# Patient Record
Sex: Male | Born: 1965
Health system: Southern US, Community
[De-identification: ages and names within clinical notes are randomized; demographics above are authoritative.]

## PROBLEM LIST (undated history)

## (undated) DIAGNOSIS — M5416 Radiculopathy, lumbar region: Secondary | ICD-10-CM

## (undated) DIAGNOSIS — E785 Hyperlipidemia, unspecified: Secondary | ICD-10-CM

## (undated) DIAGNOSIS — H9319 Tinnitus, unspecified ear: Secondary | ICD-10-CM

## (undated) HISTORY — DX: Radiculopathy, lumbar region: M54.16

## (undated) HISTORY — PX: OTHER SURGICAL HISTORY: SHX169

## (undated) HISTORY — DX: Hyperlipidemia, unspecified: E78.5

## (undated) HISTORY — DX: Tinnitus, unspecified ear: H93.19

---

## 2011-12-25 ENCOUNTER — Ambulatory Visit: Payer: Self-pay | Admitting: Family Medicine

## 2012-04-15 ENCOUNTER — Encounter: Payer: Self-pay | Admitting: Family Medicine

## 2012-04-15 ENCOUNTER — Ambulatory Visit (INDEPENDENT_AMBULATORY_CARE_PROVIDER_SITE_OTHER): Payer: BC Managed Care – PPO | Admitting: Family Medicine

## 2012-04-15 VITALS — BP 124/80 | HR 63 | Temp 97.6°F | Ht 71.0 in | Wt 183.0 lb

## 2012-04-15 DIAGNOSIS — Z0000 Encounter for general adult medical examination without abnormal findings: Secondary | ICD-10-CM

## 2012-04-15 DIAGNOSIS — H9319 Tinnitus, unspecified ear: Secondary | ICD-10-CM

## 2012-04-15 DIAGNOSIS — B356 Tinea cruris: Secondary | ICD-10-CM

## 2012-04-15 HISTORY — DX: Tinnitus, unspecified ear: H93.19

## 2012-04-15 NOTE — Assessment & Plan Note (Signed)
Mild, no suspicion of significant pathology.  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 Reassured pt and he was ok with watchful waiting approach.  Return or call if any vertigo/dizziness, hearing deficit, or ataxia or headaches start coming with the ringing in ears.

## 2012-04-15 NOTE — Progress Notes (Signed)
Office Note 04/15/2012  CC:  Chief Complaint  Patient presents with  . Establish Care    no problems    HPI:  Alan Rojas is a 45 y.o. White male who is here today to establish care. Patient's most recent primary MD: none Old records were not reviewed prior to or during today's visit.  Has rash in groin, sweats a lot in groin, saw dermatologist (Dr. Lovey Newcomer) in the past--asks about help with keeping area from sweating so much.  Sounds like tinea cruris was dx'd and he has applied topical tx regularly but has never gotten it to completely go away--feels like this is the only region he sweats.  Also describes ringing in ears, constant for years and very mild, but worsens off and on usually when under stressful time in life, when he is behind on sleep. Denies feeling hearing loss.  No pain or muffled sensation in ears.  Notices it the most when everything is quiet.  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 No vertigo, no nausea.  No headaches or ear pain.  PMH: none (question of hx of low vit D level and high cholesterol when he last saw an MID (Dr. Ardine Bjork in [place] approx 5 yrs ago: he never returned for follow up).   PSH: none History reviewed. No pertinent past surgical history.  Family History  Problem Relation Age of Onset  . Hypertension Mother   . Hypertension Maternal Grandmother   . Prostate cancer Maternal Grandfather     History   Social History  . Marital Status: Married    Spouse Name: N/A    Number of Children: N/A  . Years of Education: N/A   Occupational History  . Not on file.   Social History Main Topics  . Smoking status: Never Smoker   . Smokeless tobacco: Never Used  . Alcohol Use: No  . Drug Use: No  . Sexually Active: Not on file   Other Topics Concern  . 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  Wife has degree in  education.Relocated for [employer] approx 2006 to work for [employer].No T/A/Ds.Exercises: walks in neighborhood 3-4 times per week.    Outpatient Encounter Prescriptions as of 04/15/2012  Medication Sig Dispense Refill  . Cholecalciferol (VITAMIN D PO) Take 1 tablet by mouth daily.      . Flaxseed, Linseed, (FLAX SEED OIL PO) Take 1 capsule by mouth daily.        Not on File  ROS Review of Systems  Constitutional: Negative for fever and fatigue.  HENT: Negative for congestion and sore throat.   Eyes: Negative for visual disturbance.  Respiratory: Negative for cough.   Cardiovascular: Negative for chest pain.  Gastrointestinal: Negative for nausea and abdominal pain.  Genitourinary: Negative for dysuria.  Musculoskeletal: Negative for back pain and joint swelling.  Neurological: Negative for weakness and headaches.  Hematological: Negative for adenopathy.    PE; Blood pressure 124/80, pulse 63, temperature 97.6 F (36.4 C), temperature source Temporal, height 5\' 11"  (1.803 m), weight 183 lb (83.008 kg), SpO2 99.00%. Gen: Alert, well appearing.  Patient is oriented to person, place, time, and situation. EARS: EAC's normal AU, TM's with good light reflex and landmarks bilaterally.   CN 2-12 intact grossly bilaterally.  Pertinent labs:  none  ASSESSMENT AND PLAN:   New pt: no old records to obtain.  Tinnitus Mild, no suspicion of significant pathology.  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 Reassured pt and he was ok with watchful waiting approach.  Return or call if any vertigo/dizziness, hearing deficit, or ataxia or headaches start coming with the ringing in ears.  Tinea cruris Pt really just wanted to know if it would be ok to try antiperspirant in the groin area to try to break the cycle of tinea he gets there. I said it would be fine.  If this OTC product ineffective, will offer  Drysol.    Return for o/v for CPE at pt's convenience; also needs lab visit for fasting labs the week prior.

## 2012-04-15 NOTE — Assessment & Plan Note (Signed)
Pt really just wanted to know if it would be ok to try antiperspirant in the groin area to try to break the cycle of tinea he gets there. I said it would be fine.  If this OTC product ineffective, will offer Drysol.

## 2012-04-29 ENCOUNTER — Other Ambulatory Visit (INDEPENDENT_AMBULATORY_CARE_PROVIDER_SITE_OTHER): Payer: BC Managed Care – PPO

## 2012-04-29 DIAGNOSIS — Z0000 Encounter for general adult medical examination without abnormal findings: Secondary | ICD-10-CM

## 2012-04-29 LAB — LIPID PANEL
HDL: 50.4 mg/dL (ref >39.00)
Triglycerides: 84 mg/dL (ref 0.0–149.0)
VLDL: 16.8 mg/dL (ref 0.0–40.0)

## 2012-04-29 LAB — CBC WITH DIFFERENTIAL/PLATELET
Basophils Relative: 0.4 % (ref 0.0–3.0)
Eosinophils Relative: 1.9 % (ref 0.0–5.0)
HCT: 39.6 % (ref 39.0–52.0)
Hemoglobin: 13 g/dL (ref 13.0–17.0)
Lymphocytes Relative: 27.8 % (ref 12.0–46.0)
Lymphs Abs: 1 10*3/uL (ref 0.7–4.0)
Monocytes Relative: 10 % (ref 3.0–12.0)
Neutro Abs: 2.2 10*3/uL (ref 1.4–7.7)
RBC: 4.49 Mil/uL (ref 4.22–5.81)
WBC: 3.7 10*3/uL — ABNORMAL LOW (ref 4.5–10.5)

## 2012-04-29 LAB — COMPREHENSIVE METABOLIC PANEL
AST: 21 U/L (ref 0–37)
Albumin: 4.5 g/dL (ref 3.5–5.2)
Alkaline Phosphatase: 54 U/L (ref 39–117)
BUN: 15 mg/dL (ref 6–23)
Potassium: 4.4 mEq/L (ref 3.5–5.1)
Sodium: 141 mEq/L (ref 135–145)
Total Bilirubin: 0.6 mg/dL (ref 0.3–1.2)
Total Protein: 7.2 g/dL (ref 6.0–8.3)

## 2012-04-29 LAB — LDL CHOLESTEROL, DIRECT: Direct LDL: 137.1 mg/dL

## 2012-05-06 ENCOUNTER — Encounter: Payer: Self-pay | Admitting: Family Medicine

## 2012-05-06 ENCOUNTER — Ambulatory Visit (INDEPENDENT_AMBULATORY_CARE_PROVIDER_SITE_OTHER): Payer: BC Managed Care – PPO | Admitting: Family Medicine

## 2012-05-06 VITALS — BP 133/87 | HR 68 | Temp 97.6°F | Ht 71.0 in | Wt 184.8 lb

## 2012-05-06 DIAGNOSIS — Z23 Encounter for immunization: Secondary | ICD-10-CM

## 2012-05-06 DIAGNOSIS — Z0000 Encounter for general adult medical examination without abnormal findings: Secondary | ICD-10-CM

## 2012-05-06 NOTE — Progress Notes (Signed)
Office Note 05/06/2012  CC:  Chief Complaint  Patient presents with  . Annual Exam    physical    HPI:  Alan Rojas is a 46 y.o. White male who goes by "Alan Rojas" and is here for annual health maintenance exam. He has no complaints.  We reviewed his labs today in detail--all were good.   PMH: no significant problems, no hospitalization.  No past medical history on file.  PSH: none No past surgical history on file.  Family History  Problem Relation Age of Onset  . Hypertension Mother   . Hypertension Maternal Grandmother   . Prostate cancer Maternal Grandfather     History   Social History  . Marital Status: Married    Spouse Name: N/A    Number of Children: N/A  . Years of Education: N/A   Occupational History  . Not on file.   Social History Main Topics  . Smoking status: Never Smoker   . Smokeless tobacco: Never Used  . Alcohol Use: No  . Drug Use: No  . Sexually Active: Not on file   Other Topics Concern  . 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  Wife has degree in education.Relocated for [employer] approx 2006 to work for [employer].No T/A/Ds.Exercises: walks in neighborhood 3-4 times per week.    Outpatient Prescriptions Prior to Visit  Medication Sig Dispense Refill  . Flaxseed, Linseed, (FLAX SEED OIL PO) Take 1 capsule by mouth daily.      . Cholecalciferol (VITAMIN D PO) Take 1 tablet by mouth daily.        No Known Allergies  ROS Review of Systems  Constitutional: Negative for fever, chills, appetite change and fatigue.  HENT: Negative for ear pain, congestion, sore throat, neck stiffness and dental problem.   Eyes: Negative for discharge, redness and visual disturbance.  Respiratory: Negative for cough, chest tightness, shortness of breath and wheezing.   Cardiovascular: Negative for chest pain, palpitations and leg swelling.  Gastrointestinal:  Negative for nausea, vomiting, abdominal pain, diarrhea and blood in stool.  Genitourinary: Negative for dysuria, urgency, frequency, hematuria, flank pain and difficulty urinating.  Musculoskeletal: Negative for myalgias, back pain, joint swelling and arthralgias.  Skin: Negative for pallor and rash.  Neurological: Negative for dizziness, speech difficulty, weakness and headaches.  Hematological: Negative for adenopathy. Does not bruise/bleed easily.  Psychiatric/Behavioral: Negative for confusion and disturbed wake/sleep cycle. The patient is not nervous/anxious.     PE; Blood pressure 133/87, pulse 68, temperature 97.6 F (36.4 C), temperature source Temporal, height 5\' 11"  (1.803 m), weight 184 lb 12.8 oz (83.825 kg), SpO2 98.00%. Gen: Alert, well appearing.  Patient is oriented to person, place, time, and situation. Affect: pleasant, lucid thought and speech. ENT: Ears: EACs clear, normal epithelium.  TMs with good light reflex and landmarks bilaterally.  Eyes: no injection, icteris, swelling, or exudate.  EOMI, PERRLA. Nose: no drainage or turbinate edema/swelling.  No injection or focal lesion.  Mouth: lips without lesion/swelling.  Oral mucosa pink and moist.  Dentition intact and without obvious caries or gingival swelling.  Oropharynx without erythema, exudate, or swelling.  Neck: supple/nontender.  No LAD, mass, or TM.  Carotid pulses 2+ bilaterally, without bruits. CV: RRR, no m/r/g.   LUNGS: CTA bilat, nonlabored resps, good aeration in all lung fields. ABD: soft, NT, ND, BS normal.  No hepatospenomegaly or mass.  No bruits. EXT: no clubbing,  cyanosis, or edema.  Skin - no sores or suspicious lesions or rashes or color changes Genitals normal; both testes normal without tenderness, masses, hydroceles, varicoceles, erythema or swelling. Shaft normal, circumcised, meatus normal without discharge. No inguinal hernia noted. No inguinal lymphadenopathy.  Pertinent labs:  Lab Results    Component Value Date   TSH 1.96 04/29/2012   Lab Results  Component Value Date   WBC 3.7* 04/29/2012   HGB 13.0 04/29/2012   HCT 39.6 04/29/2012   MCV 88.2 04/29/2012   PLT 139.0* 04/29/2012   Lab Results  Component Value Date   CREATININE 1.0 04/29/2012   BUN 15 04/29/2012   NA 141 04/29/2012   K 4.4 04/29/2012   CL 102 04/29/2012   CO2 31 04/29/2012   Lab Results  Component Value Date   ALT 24 04/29/2012   AST 21 04/29/2012   ALKPHOS 54 04/29/2012   BILITOT 0.6 04/29/2012   Lab Results  Component Value Date   CHOL 201* 04/29/2012   Lab Results  Component Value Date   HDL 50.40 04/29/2012   No results found for this basename: LDLCALC   Lab Results  Component Value Date   TRIG 84.0 04/29/2012   Lab Results  Component Value Date   CHOLHDL 4 04/29/2012   No results found for this basename: PSA       ASSESSMENT AND PLAN:   Routine general medical examination at a health care facility Reviewed age and gender appropriate health maintenance issues (prudent diet, regular exercise, health risks of tobacco and excessive alcohol, use of seatbelts, fire alarms in home, use of sunscreen).  Also reviewed age and gender appropriate health screening as well as vaccine recommendations.   Tdap given today.  FOLLOW UP:  No Follow-up on file. F/u for routine med exam in 1-2 yrs.

## 2012-05-06 NOTE — Assessment & Plan Note (Signed)
Reviewed age and gender appropriate health maintenance issues (prudent diet, regular exercise, health risks of tobacco and excessive alcohol, use of seatbelts, fire alarms in home, use of sunscreen).  Also reviewed age and gender appropriate health screening as well as vaccine recommendations.  

## 2012-05-06 NOTE — Addendum Note (Signed)
Addended by: Court Joy on: 05/06/2012 09:19 AM   Modules accepted: Orders

## 2012-05-06 NOTE — Addendum Note (Signed)
Addended by: Baldemar Lenis R on: 05/06/2012 10:02 AM   Modules accepted: Orders

## 2012-05-06 NOTE — Patient Instructions (Signed)
Health Maintenance, Males A healthy lifestyle and preventative care can promote health and wellness.  Maintain regular health, dental, and eye exams.   Eat a healthy diet. Foods like vegetables, fruits, whole grains, low-fat dairy products, and lean protein foods contain the nutrients you need without too many calories. Decrease your intake of foods high in solid fats, added sugars, and salt. Get information about a proper diet from your caregiver, if necessary.   Regular physical exercise is one of the most important things you can do for your health. Most adults should get at least 150 minutes of moderate-intensity exercise (any activity that increases your heart rate and causes you to sweat) each week. In addition, most adults need muscle-strengthening exercises on 2 or more days a week.    Maintain a healthy weight. The body mass index (BMI) is a screening tool to identify possible weight problems. It provides an estimate of body fat based on height and weight. Your caregiver can help determine your BMI, and can help you achieve or maintain a healthy weight. For adults 20 years and older:   A BMI below 18.5 is considered underweight.   A BMI of 18.5 to 24.9 is normal.   A BMI of 25 to 29.9 is considered overweight.   A BMI of 30 and above is considered obese.   Maintain normal blood lipids and cholesterol by exercising and minimizing your intake of saturated fat. Eat a balanced diet with plenty of fruits and vegetables. Blood tests for lipids and cholesterol should begin at age 20 and be repeated every 5 years. If your lipid or cholesterol levels are high, you are over 50, or you are a high risk for heart disease, you may need your cholesterol levels checked more frequently.Ongoing high lipid and cholesterol levels should be treated with medicines, if diet and exercise are not effective.   If you smoke, find out from your caregiver how to quit. If you do not use tobacco, do not start.    If you choose to drink alcohol, do not exceed 2 drinks per day. One drink is considered to be 12 ounces (355 mL) of beer, 5 ounces (148 mL) of wine, or 1.5 ounces (44 mL) of liquor.   Avoid use of street drugs. Do not share needles with anyone. Ask for help if you need support or instructions about stopping the use of drugs.   High blood pressure causes heart disease and increases the risk of stroke. Blood pressure should be checked at least every 1 to 2 years. Ongoing high blood pressure should be treated with medicines if weight loss and exercise are not effective.   If you are 45 to 46 years old, ask your caregiver if you should take aspirin to prevent heart disease.   Diabetes screening involves taking a blood sample to check your fasting blood sugar level. This should be done once every 3 years, after age 45, if you are within normal weight and without risk factors for diabetes. Testing should be considered at a younger age or be carried out more frequently if you are overweight and have at least 1 risk factor for diabetes.   Colorectal cancer can be detected and often prevented. Most routine colorectal cancer screening begins at the age of 50 and continues through age 75. However, your caregiver may recommend screening at an earlier age if you have risk factors for colon cancer. On a yearly basis, your caregiver may provide home test kits to check for hidden   blood in the stool. Use of a small camera at the end of a tube, to directly examine the colon (sigmoidoscopy or colonoscopy), can detect the earliest forms of colorectal cancer. Talk to your caregiver about this at age 50, when routine screening begins. Direct examination of the colon should be repeated every 5 to 10 years through age 75, unless early forms of pre-cancerous polyps or small growths are found.   Hepatitis C blood testing is recommended for all people born from 1945 through 1965 and any individual with known risks for  hepatitis C.   Healthy men should no longer receive prostate-specific antigen (PSA) blood tests as part of routine cancer screening. Consult with your caregiver about prostate cancer screening.   Testicular cancer screening is not recommended for adolescents or adult males who have no symptoms. Screening includes self-exam, caregiver exam, and other screening tests. Consult with your caregiver about any symptoms you have or any concerns you have about testicular cancer.   Practice safe sex. Use condoms and avoid high-risk sexual practices to reduce the spread of sexually transmitted infections (STIs).   Use sunscreen with a sun protection factor (SPF) of 30 or greater. Apply sunscreen liberally and repeatedly throughout the day. You should seek shade when your shadow is shorter than you. Protect yourself by wearing long sleeves, pants, a wide-brimmed hat, and sunglasses year round, whenever you are outdoors.   Notify your caregiver of new moles or changes in moles, especially if there is a change in shape or color. Also notify your caregiver if a mole is larger than the size of a pencil eraser.   A one-time screening for abdominal aortic aneurysm (AAA) and surgical repair of large AAAs by sound wave imaging (ultrasonography) is recommended for ages 65 to 75 years who are current or former smokers.   Stay current with your immunizations.  Document Released: 03/02/2008 Document Revised: 08/24/2011 Document Reviewed: 01/30/2011 Exit[employer] Patient Information 2012 [employer]. 

## 2012-09-04 ENCOUNTER — Encounter: Payer: Self-pay | Admitting: Family Medicine

## 2012-09-04 ENCOUNTER — Ambulatory Visit (INDEPENDENT_AMBULATORY_CARE_PROVIDER_SITE_OTHER): Payer: BC Managed Care – PPO | Admitting: Family Medicine

## 2012-09-04 VITALS — BP 119/77 | HR 65 | Ht 71.0 in | Wt 186.0 lb

## 2012-09-04 DIAGNOSIS — S39012A Strain of muscle, fascia and tendon of lower back, initial encounter: Secondary | ICD-10-CM | POA: Insufficient documentation

## 2012-09-04 DIAGNOSIS — M5410 Radiculopathy, site unspecified: Secondary | ICD-10-CM | POA: Insufficient documentation

## 2012-09-04 DIAGNOSIS — IMO0002 Reserved for concepts with insufficient information to code with codable children: Secondary | ICD-10-CM

## 2012-09-04 DIAGNOSIS — S335XXA Sprain of ligaments of lumbar spine, initial encounter: Secondary | ICD-10-CM

## 2012-09-04 MED ORDER — CELECOXIB 200 MG PO CAPS: ORAL_CAPSULE | ORAL | Status: DC

## 2012-09-04 NOTE — Assessment & Plan Note (Signed)
With right leg radiculopathy. Discussed conservative therapy initially--standard of care.  No imaging indicated at this time. Recommended PT and symptomatic care and he agreed to this. Ordered referral to [facility] PT, recommended celebrex 200mg  daily for 2 wks, then 200 mg qd prn--samples given.  Therapeutic expectations and side effect profile of medication discussed today.  Patient's questions answered. He has flexeril already at home for prn use.

## 2012-09-04 NOTE — Progress Notes (Signed)
OFFICE NOTE  09/04/2012  CC:  Chief Complaint  Patient presents with  . Back Pain    lower back right side, radiates to thigh; ? if referral needed     HPI: Patient is a 46 y.o. Caucasian male who is here for back pain. Onset about 2 mo ago, insidious onset, no trauma or preceding incident known--has hx of occ feeling of pulled low back muscles from time to time in the past.  This time the pain in RLB has persisted, varies in intensity, has intermittent radiation down right buttocks, right hip, right thigh, into right calf.  Denies numbness in extremities or groin.  Stretching hamstrings hurts it worse.  No treatment tried yet.  When it was really intense, he took 1/2 of 10mg  flexeril and has tried advil and tylenol periodically and it helps but hasn't gone away. Otherwise he has felt okay: no unexplained fevers or weight loss, no malaise.  Pertinent PMH:  Past Medical History  Diagnosis Date  . Tinnitus 04/15/2012   History reviewed. No pertinent past surgical history.  Past family and social history reviewed and there are no changes since the patient's last office visit with me.  MEDS:  Outpatient Prescriptions Prior to Visit  Medication Sig Dispense Refill  . Flaxseed, Linseed, (FLAX SEED OIL PO) Take 1 capsule by mouth daily.      Last reviewed on 09/04/2012  9:14 AM by Jeoffrey Massed, MD  PE: Blood pressure 119/77, pulse 65, height 5\' 11"  (1.803 m), weight 186 lb (84.369 kg). Gen: Alert, well appearing.  Patient is oriented to person, place, time, and situation. All L spine ROM elicits discomfort in right lower back primarily but also some radiation into right leg down to calf.  Forward flexion goes to 110 degrees Mild TTP diffusely in lumbar spine centrally and in paraspinous soft tissues. LE strength 5/5 bilat.  DTRs 2+ bilat in patellar and achilles areas.   IMPRESSION AND PLAN:  Low back strain With right leg radiculopathy. Discussed conservative therapy  initially--standard of care.  No imaging indicated at this time. Recommended PT and symptomatic care and he agreed to this. Ordered referral to [facility] PT, recommended celebrex 200mg  daily for 2 wks, then 200 mg qd prn--samples given.  Therapeutic expectations and side effect profile of medication discussed today.  Patient's questions answered. He has flexeril already at home for prn use.   An After Visit Summary was printed and given to the patient.  FOLLOW UP:  6 wks

## 2013-01-09 ENCOUNTER — Encounter: Payer: Self-pay | Admitting: Family Medicine

## 2013-01-09 ENCOUNTER — Ambulatory Visit (INDEPENDENT_AMBULATORY_CARE_PROVIDER_SITE_OTHER): Payer: BC Managed Care – PPO | Admitting: Family Medicine

## 2013-01-09 VITALS — BP 117/77 | HR 106 | Temp 102.3°F | Resp 16 | Wt 187.0 lb

## 2013-01-09 DIAGNOSIS — J9801 Acute bronchospasm: Secondary | ICD-10-CM

## 2013-01-09 MED ORDER — AZITHROMYCIN 250 MG PO TABS: ORAL_TABLET | ORAL | Status: DC

## 2013-01-09 MED ORDER — HYDROCODONE-ACETAMINOPHEN 5-325 MG PO TABS: ORAL_TABLET | ORAL | Status: DC

## 2013-01-09 MED ORDER — ALBUTEROL SULFATE (2.5 MG/3ML) 0.083% IN NEBU
2.5000 mg | INHALATION_SOLUTION | Freq: Once | RESPIRATORY_TRACT | Status: AC
  Administered 2013-01-09: 2.5 mg via RESPIRATORY_TRACT

## 2013-01-09 NOTE — Progress Notes (Signed)
OFFICE NOTE  01/09/2013  CC:  Chief Complaint  Patient presents with  . Generalized Body Aches    Pt c/o body aches & pain; fever; chills; non-productive cough x1 day     HPI: Patient is a 47 y.o. Caucasian male who is here for fever and resp sx's. He had a uri about 2 wks ago and the cough never went away--central chest, dry/nonproductive, can't take deep breath b/c he wants to cough so much after one.  Then yesterday he had onset of aching all over and fever.  Temp at home 100-101 after tylenol and advil.  This med helped his aches some but not for long.  No n/v/d.  No ST.  +HA--top of head. Claritin/allegra/mucinex taken--no help.   Pertinent PMH:  Past Medical History  Diagnosis Date  . Tinnitus 04/15/2012   History reviewed. No pertinent past surgical history.  MEDS:  Tylenol alt w/ advil  PE: Blood pressure 117/77, pulse 106, temperature 102.3 F (39.1 C), temperature source Oral, resp. rate 16, weight 187 lb (84.823 kg), SpO2 96.00%. Gen: alert, appears tired and ill but nontoxic.   HEENT: eyes without injection, drainage, or swelling.  Ears: EACs clear, TMs with normal light reflex and landmarks.  Nose: Clear rhinorrhea, with some dried, crusty exudate adherent to mildly injected mucosa.  No purulent d/c.  No paranasal sinus TTP.  No facial swelling.  Throat and mouth without focal lesion.  No pharyngial swelling, erythema, or exudate.   Neck: supple, no LAD.   LUNGS: CTA bilat, nonlabored resps.   CV: RRR, no m/r/g. EXT: no c/c/e SKIN: no rash MUSC: no muscle tenderness.  Neck: no sign of nuchal rigidity  IMPRESSION AND PLAN:  Prolonged lower resp tract sx's, now with fever/systemic signs of illness: will treat as possible bacterial bronchitis--azithromycin x 5d. Discussed possible addition of oral steroids but pt declined, citing past unpleasant experience/intolerance.  Norco 5/325, 1-2 q6h prn for symptoms of achiness/cough.  May try mucinex DM in daytime if  hydrocodone too sedating for daytime use. Of note, he felt no significant improvement in breathing/cough after getting an albut/atrovent neb in office today, so he declined an inhaler for prn use at home. FOLLOW UP: prn.  If not improved by the end of the weekend then return for recheck, seek care earlier if worsening. Signs/symptoms to call or return for were reviewed and pt expressed understanding.

## 2013-01-10 ENCOUNTER — Telehealth: Payer: Self-pay | Admitting: Family Medicine

## 2013-01-10 NOTE — Telephone Encounter (Signed)
Patient Information:  Caller Name: Alan Rojas  Phone: [phone]  Patient: Alan Rojas, Alan Rojas  Gender: Male  DOB: 17-Jun-1966  Age: 47 Years  PCP: Alan Rojas [facility])  Office Follow Up:  Does the office need to follow up with this patient?: No  Instructions For The Office: N/A  RN Note:  Patient has high fever 01/09/13 to 104, cough, and body aches.  Diagnosed with bronchitis and given zpack.  Told to take Mucinex DM.  Alternating tylenol and advil for fever, but fever is climbing again.  Body aches.  States he feels like he did when he had [facility] Spotted Fever in 2007.  States unable to take a deep breath without coughing.  Needed a breathing treatment while in office 01/09/13, but does not have nebulizer or inhaler at home.  Denies overt wheezing. Has had fever x 48 hours.  Per flu and cough protocols, advised appt; appt scheduled 01/11/13 1215 at [facility] office.  krs/can  Symptoms  Reason For Call & Symptoms: seen in office 01/09/13 and diagnosed with bronchitis.  Temp to 104 O.  Reviewed Health History In EMR: Yes  Reviewed Medications In EMR: Yes  Reviewed Allergies In EMR: Yes  Reviewed Surgeries / Procedures: Yes  Date of Onset of Symptoms: 01/08/2013  Any Fever: Yes  Fever Taken: Oral  Fever Time Of Reading: 16:30:00  Fever Last Reading: 102  Guideline(s) Used:  Influenza - Seasonal  Cough  Disposition Per Guideline:   See Today or Tomorrow in Office  Reason For Disposition Reached:   Continuous (nonstop) coughing interferes with work or school and no improvement using cough treatment per Care Advice  Advice Given:  N/A  Patient Will Follow Care Advice:  YES  Appointment Scheduled:  01/11/2013 12:15:00 Appointment Scheduled Provider:  N/A

## 2013-01-11 ENCOUNTER — Encounter: Payer: Self-pay | Admitting: Internal Medicine

## 2013-01-11 ENCOUNTER — Ambulatory Visit (INDEPENDENT_AMBULATORY_CARE_PROVIDER_SITE_OTHER): Payer: BC Managed Care – PPO | Admitting: Internal Medicine

## 2013-01-11 ENCOUNTER — Ambulatory Visit (HOSPITAL_COMMUNITY)
Admission: RE | Admit: 2013-01-11 | Discharge: 2013-01-11 | Disposition: A | Discharge status: Home / Self Care | Payer: BC Managed Care – PPO | Source: Ambulatory Visit | Attending: Internal Medicine | Admitting: Internal Medicine

## 2013-01-11 VITALS — BP 120/80 | HR 81 | Temp 99.2°F | Wt 186.0 lb

## 2013-01-11 DIAGNOSIS — R509 Fever, unspecified: Secondary | ICD-10-CM

## 2013-01-11 DIAGNOSIS — R059 Cough, unspecified: Secondary | ICD-10-CM | POA: Insufficient documentation

## 2013-01-11 DIAGNOSIS — R05 Cough: Secondary | ICD-10-CM | POA: Insufficient documentation

## 2013-01-11 LAB — CBC WITH DIFFERENTIAL/PLATELET
Basophils Absolute: 0 10*3/uL (ref 0.0–0.1)
Basophils Relative: 1 % (ref 0–1)
MCHC: 35.1 g/dL (ref 30.0–36.0)
Neutro Abs: 1.6 10*3/uL — ABNORMAL LOW (ref 1.7–7.7)
Neutrophils Relative %: 83 % — ABNORMAL HIGH (ref 43–77)
RDW: 13.4 % (ref 11.5–15.5)

## 2013-01-11 LAB — POCT URINALYSIS DIPSTICK
Leukocytes, UA: NEGATIVE
Nitrite, UA: NEGATIVE
Protein, UA: 100
pH, UA: 6.5

## 2013-01-11 MED ORDER — DOXYCYCLINE HYCLATE 100 MG PO CAPS: 100.0000 mg | ORAL_CAPSULE | Freq: Two times a day (BID) | ORAL | Status: DC

## 2013-01-11 NOTE — Progress Notes (Addendum)
Chief Complaint  Patient presents with  . Fever  . Cough    HPI: Patient comes in today for [facility] Saturday clinic  for  Ongoing  problem evaluation. Here with wife  Because had a rough night with fever 102  and chills shaking?  Myalgia   Seen 2 days ago in office for fever and cough and was placed on z pack  and has  Had 2 days of azithro mycin  And hydrocodone .  Some nausea today  .  Taking  altn ibu and tylenol for fever  Hurts to urinate a bit thick today     Drinking water a lot.    Poor po intake no vomiting or diarrhe  No rash  But had some heat redness mid abd  Fading no extremity rash or petechia  Feels a lot like when he had RMSF   No tick bite but has been out side a lot earlier in the week.  Had a cold about 6 weeks  ago and resolved but then   Cough persisted . Minor cough catch.  And then began  ill with above.  No one else sick and though allergy s x then.  ROS: See pertinent positives and negatives per HPI. No dyspnea ? If cough better but fever is not no new rashes except poss heat rash on trunk  Some headache no eye sx and not a lot of head congestion   Past Medical History  Diagnosis Date  . Tinnitus 04/15/2012    Family History  Problem Relation Age of Onset  . Hypertension Mother   . Hypertension Maternal Grandmother   . Prostate cancer Maternal Grandfather     History   Social History  . Marital Status: Married    Spouse Name: N/A    Number of Children: N/A  . Years of Education: N/A   Social History Main Topics  . Smoking status: Never Smoker   . Smokeless tobacco: Never Used  . Alcohol Use: No  . Drug Use: No  . Sexually Active: None   Other Topics Concern  . None   Social History Narrative   Married, has 4 children (17, 15, 15, 11).   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  Wife has degree in education.   Relocated for [employer] approx 2006 to work for [employer].   No T/A/Ds.   Exercises: walks in neighborhood 3-4 times per week.     Outpatient Encounter Prescriptions as of 01/11/2013  Medication Sig Dispense Refill  . azithromycin (ZITHROMAX) 250 MG tablet 2 tabs po qd x 1d then 1 tab po qd x 4d  6 each  0  . celecoxib (CELEBREX) 200 MG capsule 1 cap po qd x 14d, then 1 tab po qd prn  24 capsule  0  . fexofenadine (ALLEGRA) 180 MG tablet Take 180 mg by mouth as needed.      . Flaxseed, Linseed, (FLAX SEED OIL PO) Take 1 capsule by mouth daily.      Marland Kitchen HYDROcodone-acetaminophen (NORCO/VICODIN) 5-325 MG per tablet 1-2 tabs q6h prn  30 tablet  0  . Ketoconazole 2 % FOAM Apply topically Daily.      Marland Kitchen loratadine (CLARITIN) 10 MG tablet Take 10 mg by mouth as needed for allergies.       No facility-administered encounter medications on file as of 01/11/2013.    EXAM:  BP 120/80  Pulse 81  Temp(Src) 99.2 F (37.3 C) (Oral)  Wt 186 lb (84.369 kg)  BMI 25.95 kg/m2  SpO2 96%  Body mass index is 25.95 kg/(m^2).  GENERAL: vitals reviewed and listed above, alert, oriented, appears well hydrated and in no acute distress modestly ill non toxic wearing max  Chilled   Using extra wear   HEENT: Normocephalic ;atraumatic , Eyes;  PERRL, EOMs  Full, lids and conjunctiva clear,,Ears: no deformities, canals nl, TM landmarks normal, Nose: no deformity or discharge  Mouth : OP clear without lesion or edema . Neck: Supple without adenopathy or masses or bruits   LUNGS: clear to auscultation bilaterally, no wheezes, rales or rhonchi, good air movement  CV: HRRR, no g or m  no clubbing cyanosis or  peripheral edema nl cap refill  Abdomen:  Sof,t normal bowel sounds without hepatosplenomegaly, no guarding rebound or masses no CVA tenderness Skin: normal capillary refill ,turgor , color: No acute rashes ,petechiae or bruising blothy redness on anterior abd faded no acute rashes  MS: moves all extremities without noticeable focal  abnormality Neuro grossly non focal  PSYCH: pleasant and cooperative, no obvious depression or  anxiety  ASSESSMENT AND PLAN:  Discussed the following assessment and plan:  Fever and chills - Plan: POCT urinalysis dipstick, CBC w/Diff, Rocky mtn spotted fvr abs pnl(IgG+IgM), DG Chest 2 View Has had 3 days of axithro  Diff dx reviewed  Viral pna other    Urine clear order x ray cbc serology today and plan fu . Consider change med.  Depending on lab etc  nause is prob from  meds  -Patient advised to return or notify health care team  if symptoms worsen or persist or new concerns arise.  Patient Instructions  Urine is clear   Plan blood count and chest x ray and will contact you about results and plan  We may  Change  your antibiotic to doxycycline  To cover for potential tick disease   Neta Mends. Auriah Hollings M.D.   Cxray  clear  Waiting for lab tests na yet .   Feeling some better   Change  Med to doxycyline 100 bid for 10 days for reasons discussed . Fu with pcp after weekend if fever persists.  Sent to [employer]  24 hour pharm  Disc all of this with terry wife  Cell phone [phone]

## 2013-01-11 NOTE — Patient Instructions (Addendum)
Urine is clear   Plan blood count and chest x ray and will contact you about results and plan  We may  Change  your antibiotic to doxycycline  To cover for potential tick disease

## 2013-01-11 NOTE — Addendum Note (Signed)
Addended byMadelin Headings on: 01/11/2013 06:18 PM   Modules accepted: Orders

## 2013-01-13 ENCOUNTER — Ambulatory Visit (INDEPENDENT_AMBULATORY_CARE_PROVIDER_SITE_OTHER): Payer: BC Managed Care – PPO | Admitting: Internal Medicine

## 2013-01-13 ENCOUNTER — Encounter: Payer: Self-pay | Admitting: Internal Medicine

## 2013-01-13 ENCOUNTER — Telehealth: Payer: Self-pay | Admitting: Family Medicine

## 2013-01-13 VITALS — BP 120/80 | HR 75 | Temp 98.3°F | Wt 184.0 lb

## 2013-01-13 DIAGNOSIS — D72819 Decreased white blood cell count, unspecified: Secondary | ICD-10-CM

## 2013-01-13 DIAGNOSIS — J209 Acute bronchitis, unspecified: Secondary | ICD-10-CM | POA: Insufficient documentation

## 2013-01-13 DIAGNOSIS — R509 Fever, unspecified: Secondary | ICD-10-CM

## 2013-01-13 LAB — CBC WITH DIFFERENTIAL/PLATELET
Basophils Absolute: 0 10*3/uL (ref 0.0–0.1)
Eosinophils Absolute: 0 10*3/uL (ref 0.0–0.7)
HCT: 36.5 % — ABNORMAL LOW (ref 39.0–52.0)
Lymphocytes Relative: 39.7 % (ref 12.0–46.0)
Lymphs Abs: 0.7 10*3/uL (ref 0.7–4.0)
Monocytes Relative: 21.5 % — ABNORMAL HIGH (ref 3.0–12.0)
Platelets: 107 10*3/uL — ABNORMAL LOW (ref 150.0–400.0)
RDW: 13 % (ref 11.5–14.6)

## 2013-01-13 LAB — HEPATIC FUNCTION PANEL
AST: 347 U/L — ABNORMAL HIGH (ref 0–37)
Total Bilirubin: 0.6 mg/dL (ref 0.3–1.2)

## 2013-01-13 LAB — BASIC METABOLIC PANEL
Chloride: 101 mEq/L (ref 96–112)
GFR: 95.08 mL/min (ref >60.00)
Potassium: 3.9 mEq/L (ref 3.5–5.1)
Sodium: 140 mEq/L (ref 135–145)

## 2013-01-13 NOTE — Telephone Encounter (Signed)
Please Advise

## 2013-01-13 NOTE — Progress Notes (Signed)
Chief Complaint  Patient presents with  . Follow-up    HPI:  Patient comes in for followup (with his wife )because his primary Dr. is not available for another 2 days. ISaw him on the weekend clinic. Since that time he appear to be getting some better during the day but then gets fever or any chills in the evening last night was 100-1.7. Today it appears to be down is taking Advil for a headache this morning. He describes the headache as back up to front  And not affected .  Not a major ha but persistent  Cough  Moved up.  No shortness of breath.. Has a lots of nausea possibly related to the medicine when it was switched. No vomiting diarrhea.    ROS: See pertinent positives and negatives per HPI. No sob cp No vomited neuro sx .  No one else is sick   Past Medical History  Diagnosis Date  . Tinnitus 04/15/2012    Family History  Problem Relation Age of Onset  . Hypertension Mother   . Hypertension Maternal Grandmother   . Prostate cancer Maternal Grandfather     History   Social History  . Marital Status: Married    Spouse Name: N/A    Number of Children: N/A  . Years of Education: N/A   Social History Main Topics  . Smoking status: Never Smoker   . Smokeless tobacco: Never Used  . Alcohol Use: No  . Drug Use: No  . Sexually Active: None   Other Topics Concern  . None   Social History Narrative   Married, has 4 children (17, 15, 15, 11).   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  Wife has degree in education.   Relocated for [employer] approx 2006 to work for [employer].   No T/A/Ds.   Exercises: walks in neighborhood 3-4 times per week.    Outpatient Encounter Prescriptions as of 01/13/2013  Medication Sig Dispense Refill  . celecoxib (CELEBREX) 200 MG capsule 1 cap po qd x 14d, then 1 tab po qd prn  24 capsule  0  . doxycycline (VIBRAMYCIN) 100 MG capsule Take 1 capsule (100 mg total) by mouth 2 (two) times daily.  20 capsule  0  . fexofenadine (ALLEGRA)  180 MG tablet Take 180 mg by mouth as needed.      . Flaxseed, Linseed, (FLAX SEED OIL PO) Take 1 capsule by mouth daily.      Marland Kitchen Ketoconazole 2 % FOAM Apply topically Daily.      Marland Kitchen loratadine (CLARITIN) 10 MG tablet Take 10 mg by mouth as needed for allergies.      Marland Kitchen HYDROcodone-acetaminophen (NORCO/VICODIN) 5-325 MG per tablet 1-2 tabs q6h prn  30 tablet  0  . [DISCONTINUED] azithromycin (ZITHROMAX) 250 MG tablet 2 tabs po qd x 1d then 1 tab po qd x 4d  6 each  0   No facility-administered encounter medications on file as of 01/13/2013.    EXAM:  BP 120/80  Pulse 75  Temp(Src) 98.3 F (36.8 C) (Oral)  Wt 184 lb (83.462 kg)  BMI 25.67 kg/m2  SpO2 98%  Body mass index is 25.67 kg/(m^2).  GENERAL: vitals reviewed and listed above, alert, oriented, appears well hydrated and in no acute distress looks mildly ill nontoxic tired prefers to lay down at times  HEENT: atraumatic, conjunctiva  clear, no obvious abnormalities on inspection of external nose and ears no icterus OP : no lesion edema or exudate moist mucous  membranes  NECK: no obvious masses on inspection palpation supple no meningismus  LUNGS: clear to auscultation bilaterally, no wheezes, rales or rhonchi, good air movement Abdomen soft without organomegaly guarding or rebound CV: HRRR, no gallops or murmurs no clubbing cyanosis or  peripheral edema nl cap refill  Skin no acute rashes distal extremities exam and no petechiae ecchymosis MS: moves all extremities without noticeable focal  abnormality Neurologically he appears nonfocal intact PSYCH: pleasant and cooperative, no obvious depression or anxiety  ASSESSMENT AND PLAN:  Discussed the following assessment and plan:  Fever, unspecified - Plan: CBC with Differential, Hepatic function panel, Basic metabolic panel, Epstein-Barr virus VCA antibody panel, West Nile Antibodies, IGG and IGM, Pathologist smear review  Leukopenia - Plan: CBC with Differential, Hepatic  function panel, Basic metabolic panel, Epstein-Barr virus VCA antibody panel, West Nile Antibodies, IGG and IGM, Pathologist smear review Persistent symptoms although peak appear to be lower and afebrile times longer and no new alarming features. Physical exam is nonfocal except for minor cough. Repeat lab tests today including mono and West Nile antibodies liver tests etc. Close followup is warranted remain on the doxycycline take measures to decrease nausea. -Patient advised to return or notify health care team  if symptoms worsen or persist or new concerns arise.  Patient Instructions  Continue doxycycline  Can take with food  .  Take ibuprofen with something in stomach to avoid stomach upset.  No work until fever gone for 24 hours   Plan follow up visit depending on labs and fever  Pattern.     Neta Mends. Baili Stang M.D.   Addendum : Wbc still low 1.8  Now neutropenia and elevated transaminases.   Discussed with Dr. Drue Second who will contact him to be seen in the  ID clinic Also contacted patient discussed lab results limit Advil Tylenol except when necessary seek emergent care if worrisome symptoms. Fortunately is feeling a little bit better today   than yesterday

## 2013-01-13 NOTE — Patient Instructions (Signed)
Continue doxycycline  Can take with food  .  Take ibuprofen with something in stomach to avoid stomach upset.  No work until fever gone for 24 hours   Plan follow up visit depending on labs and fever  Pattern.

## 2013-01-13 NOTE — Telephone Encounter (Signed)
Patient Information:  Caller Name: [name]  Phone: [phone]  Patient: Alan Rojas, Alan Rojas  Gender: Male  DOB: 1965/12/09  Age: 47 Years  PCP: N/A  Office Follow Up:  Does the office need to follow up with this patient?: No  Instructions For The Office: N/A  RN Note:  Wife and patient requesting Appt today 4/28 with Dr Fabian Sharp at [facility] since she saw him on Sat 4/26 and his PMD is not available at [facility] until [facility] 4/23.  Symptoms  Reason For Call & Symptoms: Cough for over one month, fever since Wed 4/23 - fever as high as 103 during the nights.  Saw Dr Fabian Sharp  in [facility] on Sat 4/26, had CXR, Labs showing WBC down and needing Appt for reevaluation today.  Still cough, fever, sweats at night, fever 101 during last night 4/27.  Still feels bad this am.  Reviewed Health History In EMR: Yes  Reviewed Medications In EMR: Yes  Reviewed Allergies In EMR: Yes  Reviewed Surgeries / Procedures: Yes  Date of Onset of Symptoms: Unknown  Treatments Tried: Alternating Motrin and Tylenol Q4Hrs - helps temporarly  Treatments Tried Worked: No  Any Fever: Yes  Fever Taken: Oral  Fever Time Of Reading: 07:30:00  Fever Last Reading: 99.5  Guideline(s) Used:  Cough  Disposition Per Guideline:   See Today in Office  Reason For Disposition Reached:   Fever present > 3 days (72 hours)  Advice Given:  N/A  Patient Will Follow Care Advice:  YES  Appointment Scheduled:  01/13/2013 13:30:00 Appointment Scheduled Provider:  Berniece Andreas

## 2013-01-14 ENCOUNTER — Telehealth: Payer: Self-pay | Admitting: *Deleted

## 2013-01-14 LAB — ROCKY MTN SPOTTED FVR ABS PNL(IGG+IGM)
RMSF IgG: 0.68 IV
RMSF IgM: 0.2 IV

## 2013-01-14 LAB — EPSTEIN-BARR VIRUS VCA ANTIBODY PANEL
EBV EA IgG: 18.3 U/mL — ABNORMAL HIGH (ref <9.0)
EBV NA IgG: 40.7 U/mL — ABNORMAL HIGH (ref <18.0)
EBV VCA IgG: 340 U/mL — ABNORMAL HIGH (ref <18.0)
EBV VCA IgM: <10 U/mL (ref <36.0)

## 2013-01-14 NOTE — Telephone Encounter (Signed)
Called patient per Dr Drue Second and gave him an appt for 01/16/13 at 930 am. Gave him the address and phone number and he advised will be here. Also spoke to his wife and she is aware of the appt.

## 2013-01-15 LAB — WEST NILE ANTIBODIES, IGG AND IGM: West Nile Virus, IgG: <1.3 index (ref <1.30)

## 2013-01-16 ENCOUNTER — Ambulatory Visit (INDEPENDENT_AMBULATORY_CARE_PROVIDER_SITE_OTHER): Payer: BC Managed Care – PPO | Admitting: Internal Medicine

## 2013-01-16 ENCOUNTER — Telehealth: Payer: Self-pay | Admitting: *Deleted

## 2013-01-16 ENCOUNTER — Other Ambulatory Visit: Payer: Self-pay | Admitting: *Deleted

## 2013-01-16 ENCOUNTER — Encounter: Payer: Self-pay | Admitting: Internal Medicine

## 2013-01-16 VITALS — BP 119/74 | HR 59 | Temp 97.5°F | Wt 183.0 lb

## 2013-01-16 DIAGNOSIS — R509 Fever, unspecified: Secondary | ICD-10-CM

## 2013-01-16 LAB — COMPLETE METABOLIC PANEL WITH GFR
ALT: 550 U/L — ABNORMAL HIGH (ref 0–53)
AST: 328 U/L — ABNORMAL HIGH (ref 0–37)
BUN: 11 mg/dL (ref 6–23)
CO2: 32 mEq/L (ref 19–32)
Calcium: 9.7 mg/dL (ref 8.4–10.5)
Chloride: 101 mEq/L (ref 96–112)
Creat: 0.81 mg/dL (ref 0.50–1.35)
GFR, Est African American: >89 mL/min
Total Bilirubin: 0.3 mg/dL (ref 0.3–1.2)

## 2013-01-16 LAB — CBC WITH DIFFERENTIAL/PLATELET
Basophils Absolute: 0 10*3/uL (ref 0.0–0.1)
Eosinophils Absolute: 0 10*3/uL (ref 0.0–0.7)
Eosinophils Relative: 1 % (ref 0–5)
HCT: 36.6 % — ABNORMAL LOW (ref 39.0–52.0)
MCH: 29.4 pg (ref 26.0–34.0)
MCV: 83.4 fL (ref 78.0–100.0)
Monocytes Absolute: 0.4 10*3/uL (ref 0.1–1.0)
Platelets: 177 10*3/uL (ref 150–400)
RDW: 12.5 % (ref 11.5–15.5)

## 2013-01-16 NOTE — Telephone Encounter (Signed)
Per Dr Drue Second called the patient and gave him an appt for an Abdominal UT and an appt for additional labs. Advised the patient his ultra sound appt is set for 01/17/13 at 8 am at [facility] and advised him to come by the clinic tomorrow after his Ultrasound to have his labs drawn. Advised him to come before 12 pm as we close early on Friday.

## 2013-01-16 NOTE — Progress Notes (Signed)
[facility] CLINIC NOTE  RFV: FUO with leukopenia, thrombocytopenia and transaminitis Subjective:    Patient ID: Alan Rojas, male    DOB: [birth year], 47 y.o.   MRN: [id number]  HPI 47 yo M previously in good state of health started having fever to 103F on Thursday a week ago. He subsequently saw  His PCP for fevers  but also admitted to having a cough for a month. The patient was given azithromycin at that time. His rigors and fever and drenching sweats, anorexia persisted despite taking alternating doses of tylenol and ibuprofen. He came back 3 days later, on Saturday for further evaluation. Dr. Fabian Sharp ordered cxr and blood work. Switched to doxycycline and saw her again in follow up in Monday. He was found to have several lab abnormalities, leukopenia of 1.8 (down from baseline of 3.8), plt at 80s, and LFT abnormality with AST and ALT in 300s.  During this time he did not go to work due to being ill. He had lost of appetite, only taking water mostly. als reports intermittent dizziness throughout this time period. #10 lb. loss during this time period. when he started taking doxy no longer has daytime fevers, but at night time having drenching sweats.   Mowed grass a quarter acre on Monday prior to getting ill. Also mowed his own lawn with woods the weekend prior to being ill. He denies any bug bites, any tick bites nor rash. His Dry cough for the past month since having a cold in march. It is triggered by deep inspiration. Headache this morning where took advil.  Everyone else healthy no sick contacts  He carries a remote diagnosis of RMSF in the past 47 yo ago, after hiking in [place]. He was diagnosed with lab work with RMSF.  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 doxycycline (VIBRAMYCIN) 100 MG capsule Take 1 capsule (100 mg total) by mouth 2 (two) times daily.   20 capsule  0  . fexofenadine (ALLEGRA) 180 MG tablet Take 180 mg by mouth as needed.      . Flaxseed, Linseed, (FLAX SEED OIL PO) Take 1 capsule by mouth daily.      Marland Kitchen HYDROcodone-acetaminophen (NORCO/VICODIN) 5-325 MG per tablet 1-2 tabs q6h prn  30 tablet  0  . Ketoconazole 2 % FOAM Apply topically Daily.      . celecoxib (CELEBREX) 200 MG capsule 1 cap po qd x 14d, then 1 tab po qd prn  24 capsule  0  . loratadine (CLARITIN) 10 MG tablet Take 10 mg by mouth as needed for allergies.       No current facility-administered medications on file prior to visit.   Active Ambulatory Problems    Diagnosis Date Noted  . Low back strain 09/04/2012  . Radiculopathy of leg 09/04/2012  . Cough 01/11/2013  . Fever, unspecified 01/13/2013  . Leukopenia 01/13/2013  . Bronchospasm with bronchitis, acute 01/13/2013   Resolved Ambulatory Problems    Diagnosis Date Noted  . No Resolved Ambulatory Problems   Past Medical History  Diagnosis Date  . Tinnitus 04/15/2012    Review of Systems 10 point ROs reviewed, positive pertinents listed above. Otherwise 10 point ROS is negative. No gi complaints.     Objective:   Physical Exam BP 119/74  Pulse 59  Temp(Src) 97.5 F (36.4 C) (Oral)  Wt 183 lb (83.008 kg)  BMI 25.53 kg/m2 Physical Exam  Constitutional: He is oriented to person, place, and time. He appears well-developed and well-nourished. No distress.  HENT:  Mouth/Throat: Oropharynx is clear and moist. No oropharyngeal exudate.  Cardiovascular: Normal rate, regular rhythm and normal heart sounds. Exam reveals no gallop and no friction rub.  No murmur heard.  Pulmonary/Chest: Effort normal and breath sounds normal. No respiratory distress. He has no wheezes.  Abdominal: Soft. Bowel sounds are normal. He exhibits no distension. There is no tenderness.  Lymphadenopathy:  He has no cervical adenopathy.  Neurological: He is alert and oriented to person, place, and time.  Skin: Skin is warm  and dry. No rash noted. No erythema.  Psychiatric: He has a normal mood and affect. His behavior is normal.       Assessment & Plan:  Cell   47 yo Male who presents with fevers found to have leukopenia, thrombocytopenia and elevated transaminitis with mild elevated ALP c/w moderate hepatocellular abnormality. He has some outdoor exposure but no tick bites that he is aware of.  - continue on doxycyline - will check cbc and diff, CMP to see if improved since started doxycycline.  CMP     Component Value Date/Time   NA 144 01/16/2013 1042   K 4.1 01/16/2013 1042   CL 101 01/16/2013 1042   CO2 32 01/16/2013 1042   GLUCOSE 94 01/16/2013 1042   BUN 11 01/16/2013 1042   CREATININE 0.81 01/16/2013 1042   CREATININE 0.9 01/13/2013 1417   CALCIUM 9.7 01/16/2013 1042   PROT 7.2 01/16/2013 1042   ALBUMIN 4.2 01/16/2013 1042   AST 328* 01/16/2013 1042   ALT 550* 01/16/2013 1042   ALKPHOS 205* 01/16/2013 1042   BILITOT 0.3 01/16/2013 1042   Lab Results  Component Value Date   WBC 3.8* 01/16/2013   HGB 12.9* 01/16/2013   HCT 36.6* 01/16/2013   MCV 83.4 01/16/2013   PLT 177 01/16/2013   Addendum = called mark about his results. Still has hepatocellular pattern of abn to LFTs (9-10X ULN) with ALP at 2 X ULN. We will check hepatitis, hiv, ana and asma, coags. Will also get RUQ u/s.  [phone]: mark [phone]: cell number(wife)

## 2013-01-17 ENCOUNTER — Other Ambulatory Visit: Payer: BC Managed Care – PPO

## 2013-01-17 ENCOUNTER — Ambulatory Visit (HOSPITAL_COMMUNITY)
Admission: RE | Admit: 2013-01-17 | Discharge: 2013-01-17 | Disposition: A | Discharge status: Home / Self Care | Payer: BC Managed Care – PPO | Source: Ambulatory Visit | Attending: Internal Medicine | Admitting: Internal Medicine

## 2013-01-17 DIAGNOSIS — R509 Fever, unspecified: Secondary | ICD-10-CM

## 2013-01-17 DIAGNOSIS — R7402 Elevation of levels of lactic acid dehydrogenase (LDH): Secondary | ICD-10-CM | POA: Insufficient documentation

## 2013-01-17 DIAGNOSIS — K7689 Other specified diseases of liver: Secondary | ICD-10-CM | POA: Insufficient documentation

## 2013-01-17 DIAGNOSIS — R7401 Elevation of levels of liver transaminase levels: Secondary | ICD-10-CM | POA: Insufficient documentation

## 2013-01-17 LAB — CMV IGM: CMV IgM: <8 AU/mL (ref <30.00)

## 2013-01-17 LAB — EHRLICHIA ANTIBODY PANEL: E chaffeensis (HGE) Ab, IgG: NEGATIVE

## 2013-01-17 LAB — PROTIME-INR: INR: 1.06 (ref <1.50)

## 2013-01-17 LAB — HEPATITIS B CORE ANTIBODY, TOTAL: Hep B Core Total Ab: NEGATIVE

## 2013-01-17 LAB — HEPATITIS A ANTIBODY, TOTAL: Hep A Total Ab: NEGATIVE

## 2013-01-17 LAB — CYTOMEGALOVIRUS ANTIBODY, IGG: Cytomegalovirus Ab-IgG: <0.2 U/mL (ref <0.60)

## 2013-01-17 LAB — HIV ANTIBODY (ROUTINE TESTING W REFLEX): HIV: NONREACTIVE

## 2013-01-18 LAB — HEPATITIS B SURFACE ANTIBODY,QUALITATIVE: Hep B S Ab: NONREACTIVE

## 2013-01-20 LAB — ANTI-SMOOTH MUSCLE ANTIBODY, IGG: Smooth Muscle Ab: 5 U (ref <20)

## 2013-01-23 ENCOUNTER — Ambulatory Visit (INDEPENDENT_AMBULATORY_CARE_PROVIDER_SITE_OTHER): Payer: BC Managed Care – PPO | Admitting: Internal Medicine

## 2013-01-23 ENCOUNTER — Encounter: Payer: Self-pay | Admitting: Internal Medicine

## 2013-01-23 VITALS — BP 122/74 | HR 70 | Temp 97.8°F | Wt 182.0 lb

## 2013-01-23 DIAGNOSIS — R509 Fever, unspecified: Secondary | ICD-10-CM

## 2013-01-23 LAB — COMPREHENSIVE METABOLIC PANEL
ALT: 95 U/L — ABNORMAL HIGH (ref 0–53)
AST: 17 U/L (ref 0–37)
Alkaline Phosphatase: 107 U/L (ref 39–117)
CO2: 27 mEq/L (ref 19–32)
Creat: 0.88 mg/dL (ref 0.50–1.35)
Sodium: 141 mEq/L (ref 135–145)
Total Bilirubin: 0.5 mg/dL (ref 0.3–1.2)
Total Protein: 6.7 g/dL (ref 6.0–8.3)

## 2013-01-23 LAB — PROTIME-INR: Prothrombin Time: 13.5 seconds (ref 11.6–15.2)

## 2013-01-23 NOTE — Progress Notes (Signed)
[facility] CLINIC NOTE  RFV: fuo Subjective:    Patient ID: Alan Rojas, male    DOB: [birth year], 47 y.o.   MRN: [id number]  HPI has dry cough as a residual. Hits a wall at 3 pm.   taking nothing for his cough. Takes mucinex-DM. Better energy, but still easily fatigueable. Taste buds are different, coffee doesn't taste the same or fatty foods are also not appealing. Gets hungry but not attracted to food.   No more fevers, no more nightsweats.    Review of Systems     Objective:   Physical Exam        Assessment & Plan:

## 2013-02-06 ENCOUNTER — Ambulatory Visit: Payer: BC Managed Care – PPO | Admitting: Internal Medicine

## 2013-07-24 ENCOUNTER — Other Ambulatory Visit: Payer: Self-pay

## 2014-03-30 IMAGING — CR DG CHEST 2V
2 series · 2 of 2 positions shown · non-contrast
Comparison: None

CLINICAL DATA: Fever and cough

CHEST - 2 VIEW

[w chest pa]
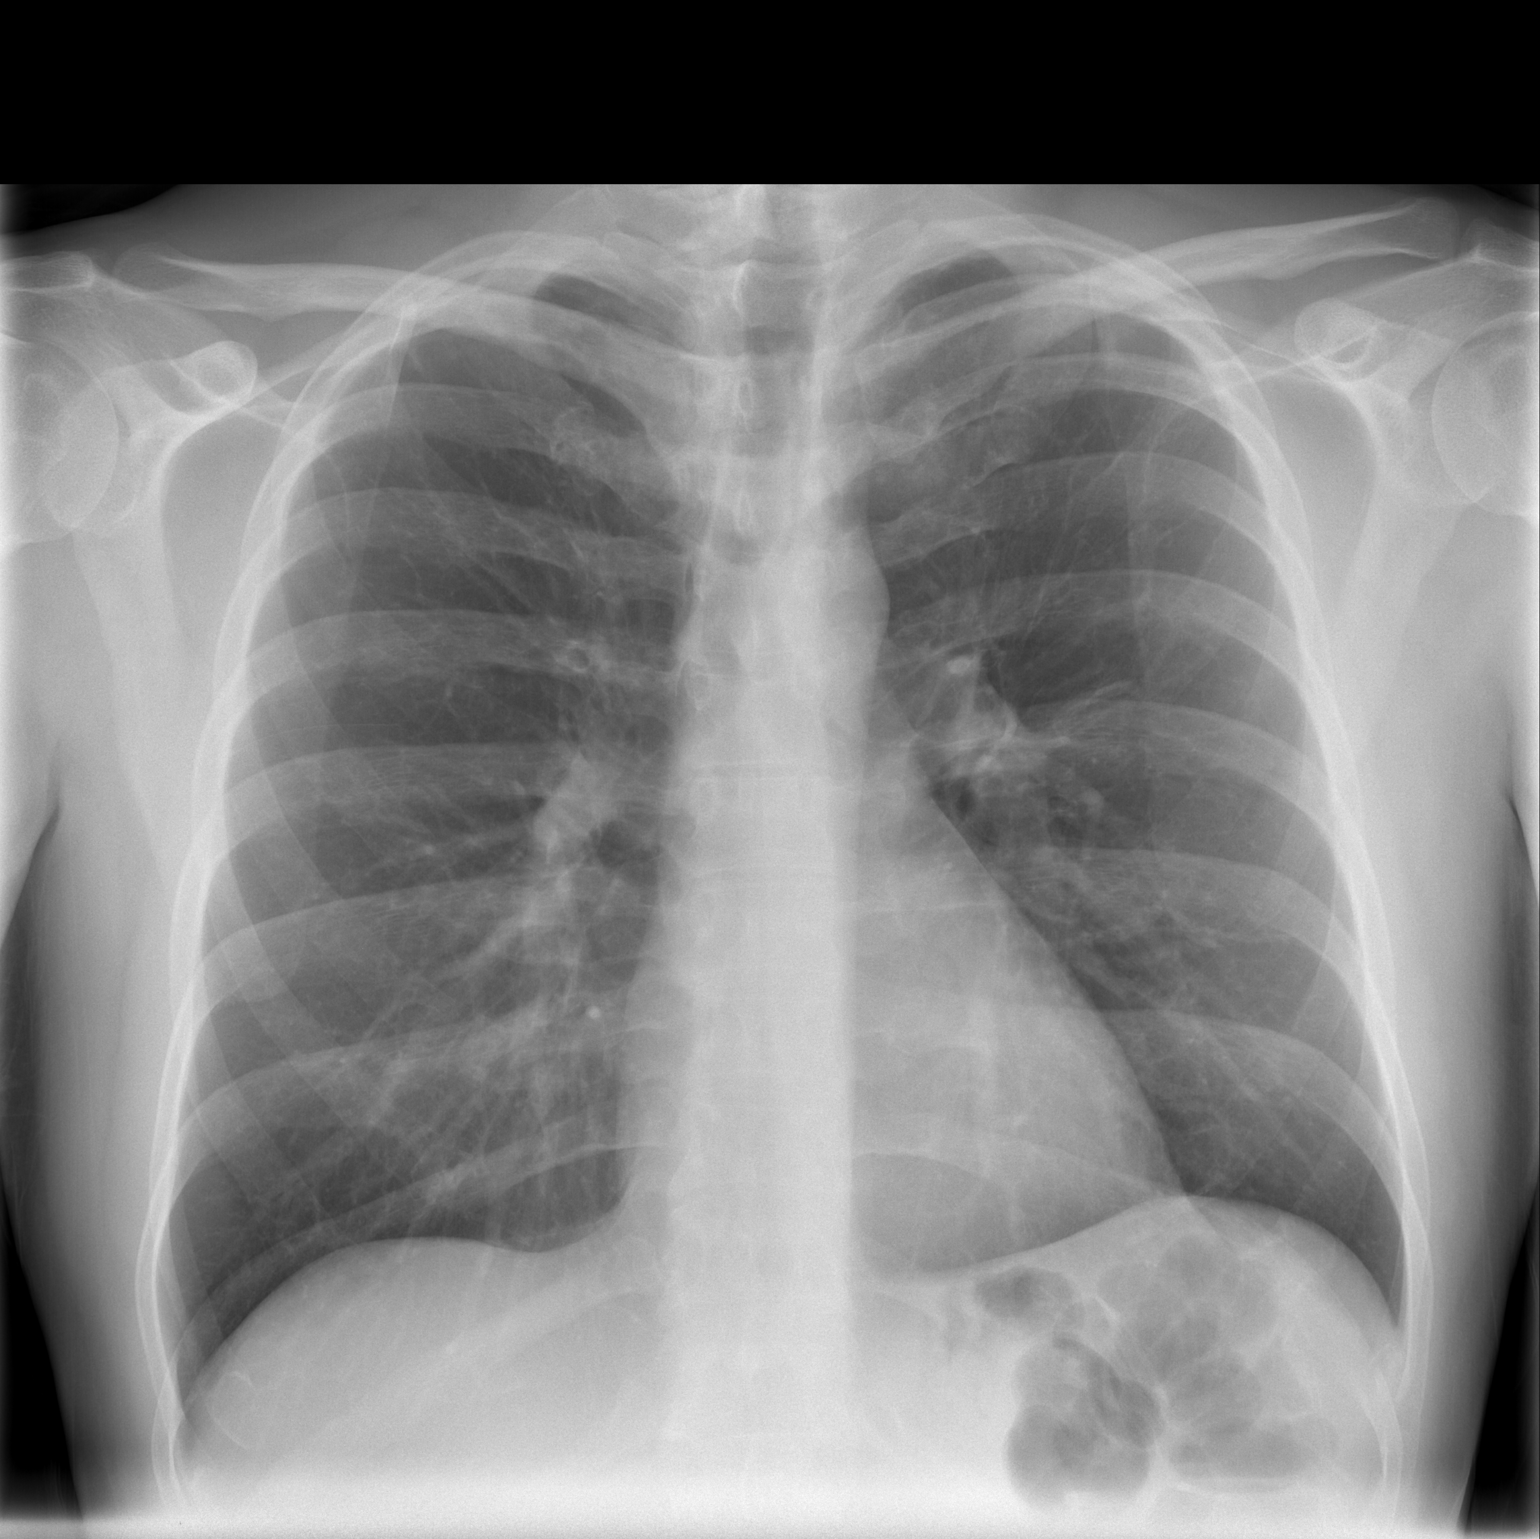

[w chest lat]
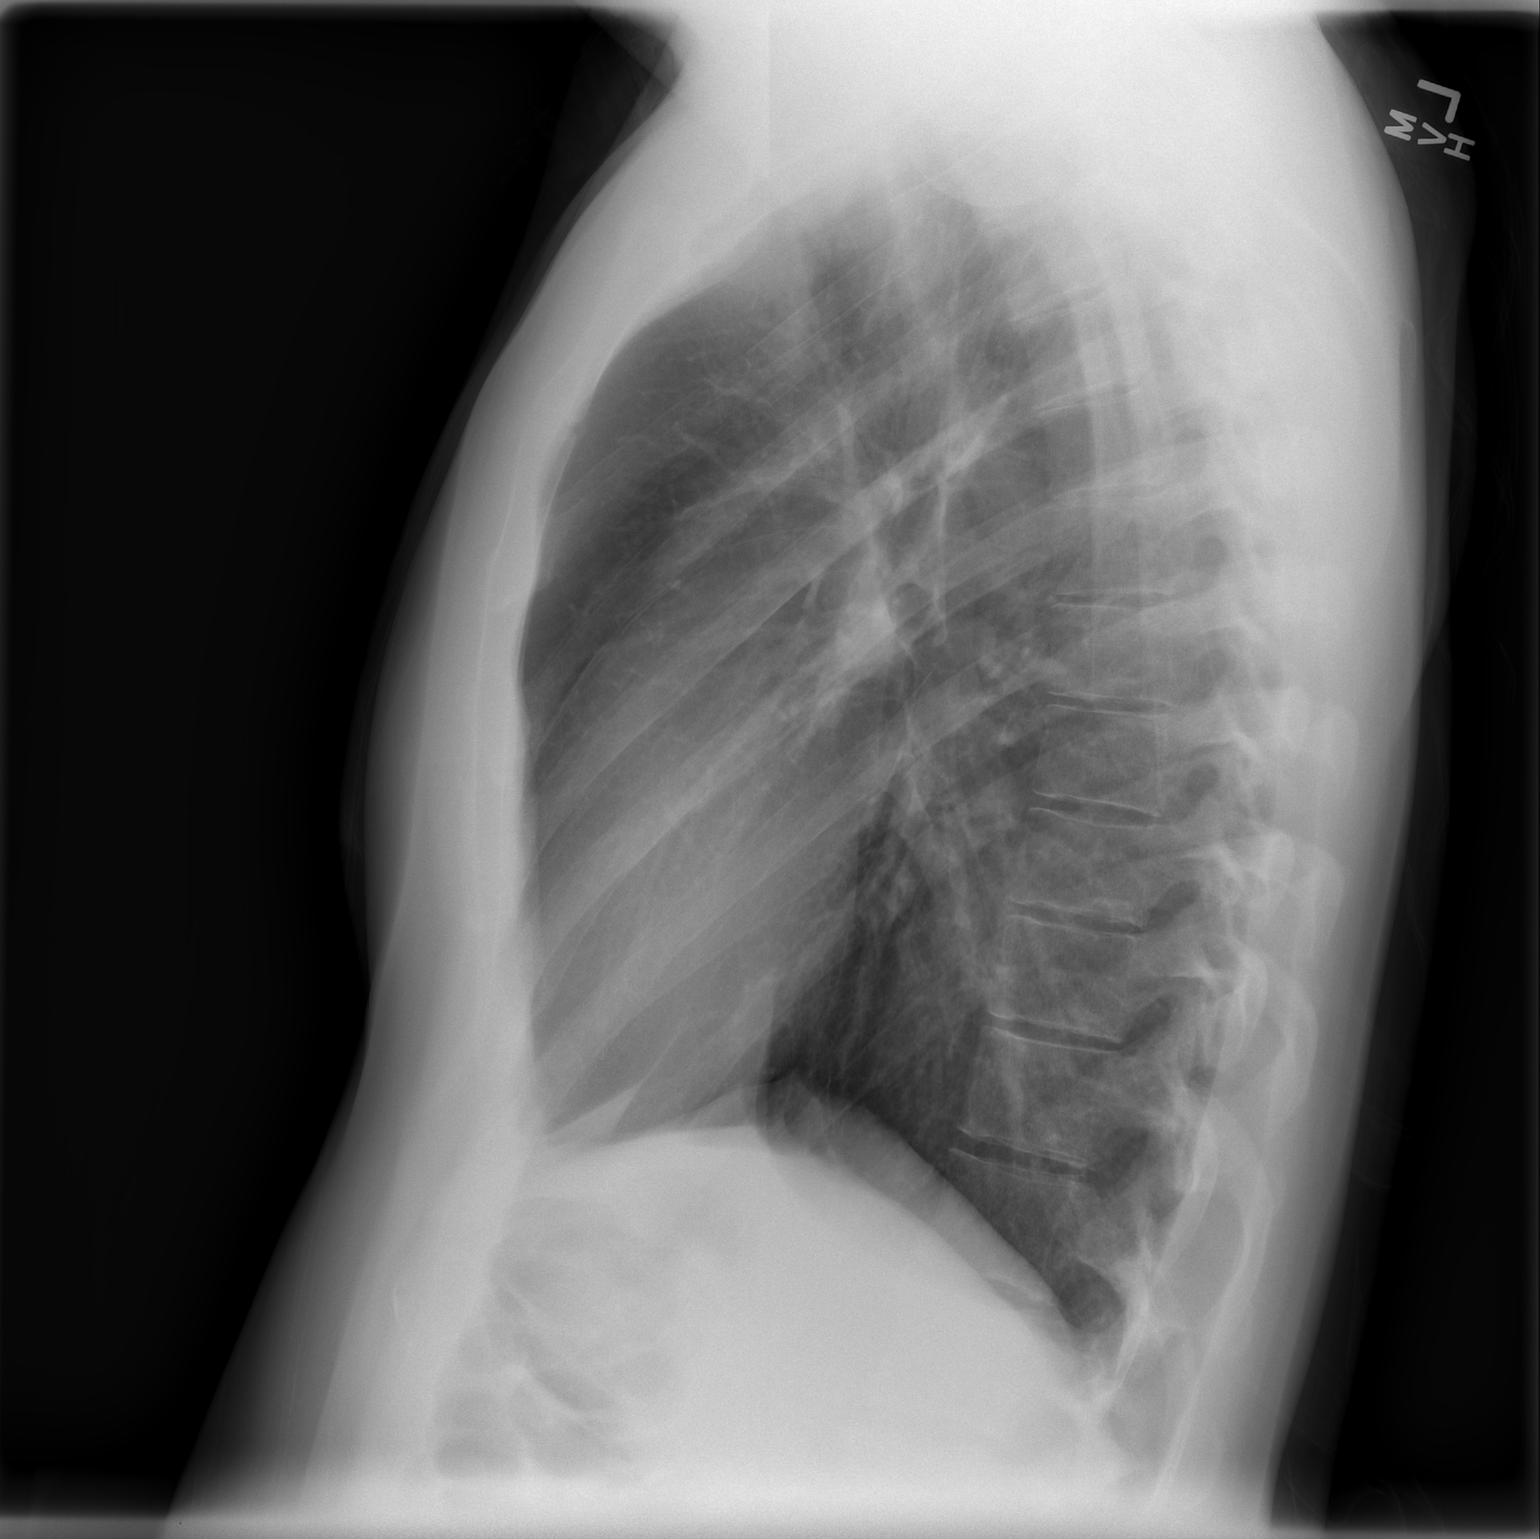

[2 of 2 positions shown; findings below may reference images not displayed]

FINDINGS: The cardiomediastinal silhouette is unremarkable.
The lungs are clear.
There is no evidence of focal airspace disease, pulmonary edema,
suspicious pulmonary nodule/mass, pleural effusion, or
pneumothorax.
No acute bony abnormalities are identified.
IMPRESSION: No evidence of active cardiopulmonary disease.

## 2014-04-05 IMAGING — US US ABDOMEN COMPLETE
1 series · 13 of 25 positions shown · non-contrast
Comparison: None.

CLINICAL DATA: Transaminases.  Evaluate for biliary obstruction

COMPLETE ABDOMINAL ULTRASOUND

[Series 1: us abdomen complete · 0.31mm/px · 13 of 108 slices shown]
[im 1/108]
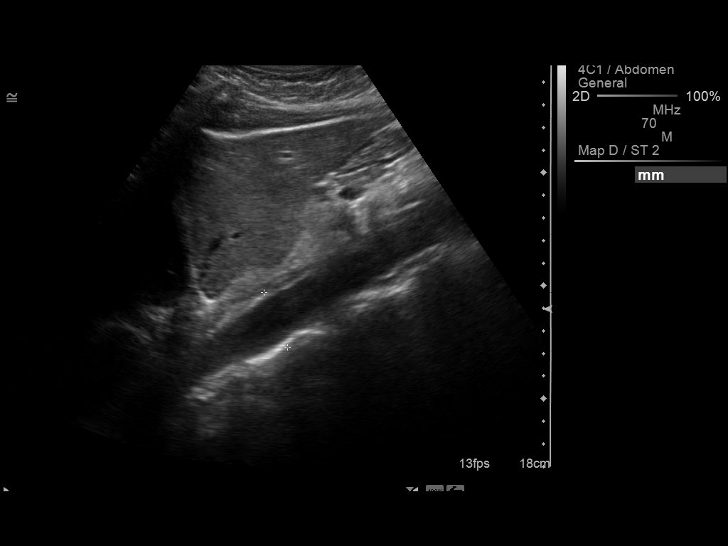
[im 9/108]
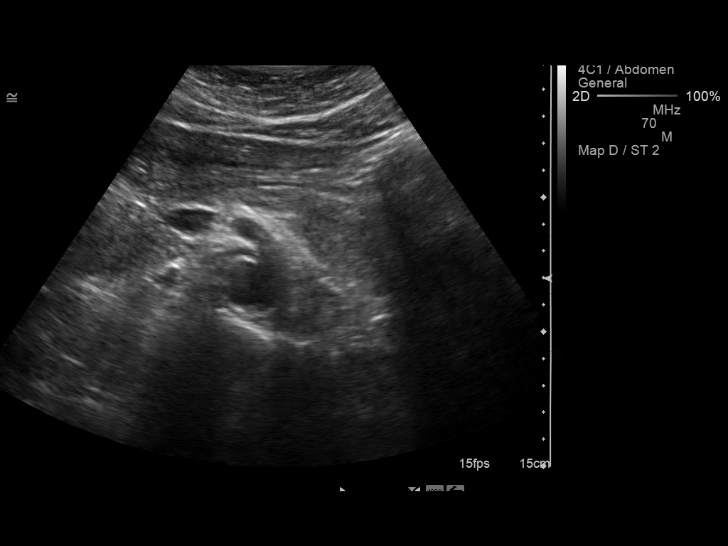
[im 18/108]
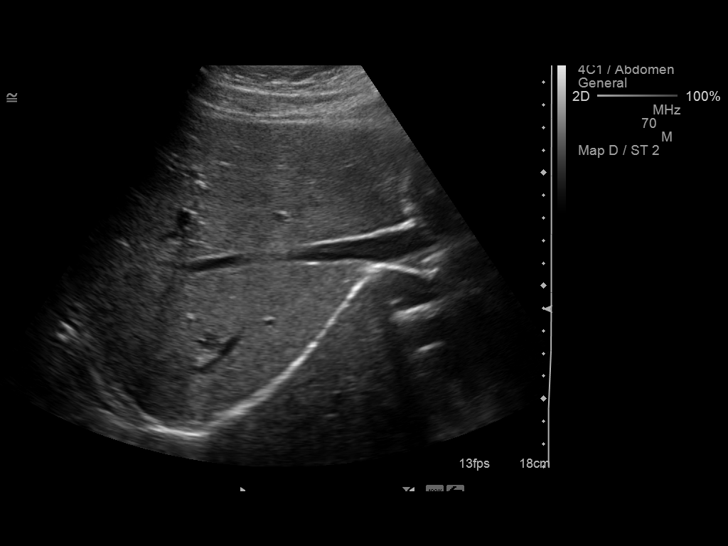
[im 27/108]
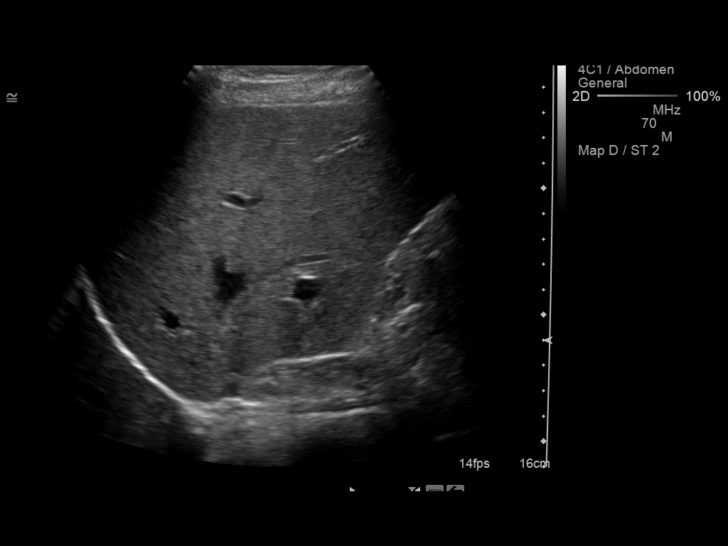
[im 36/108]
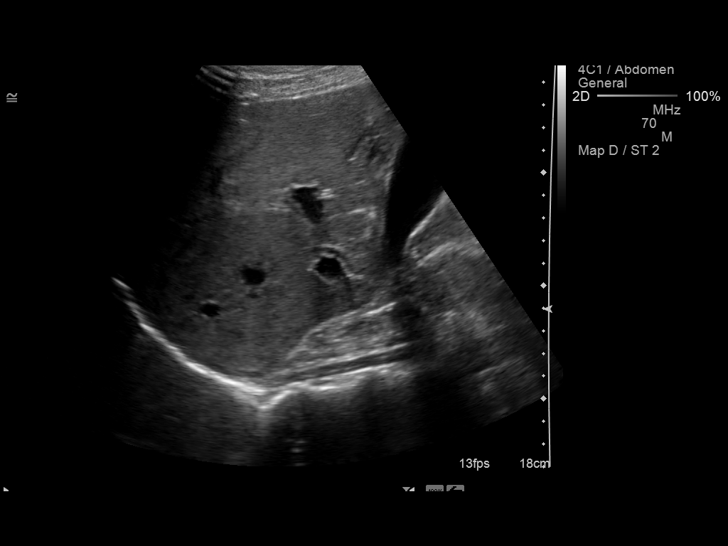
[im 45/108]
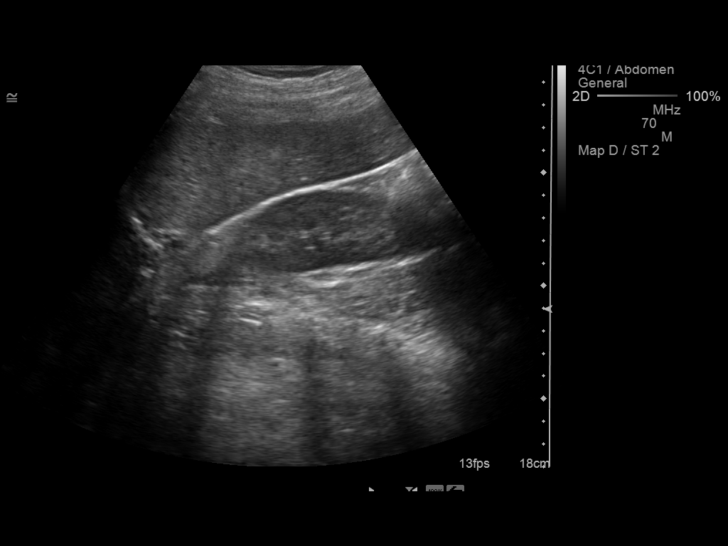
[im 54/108]
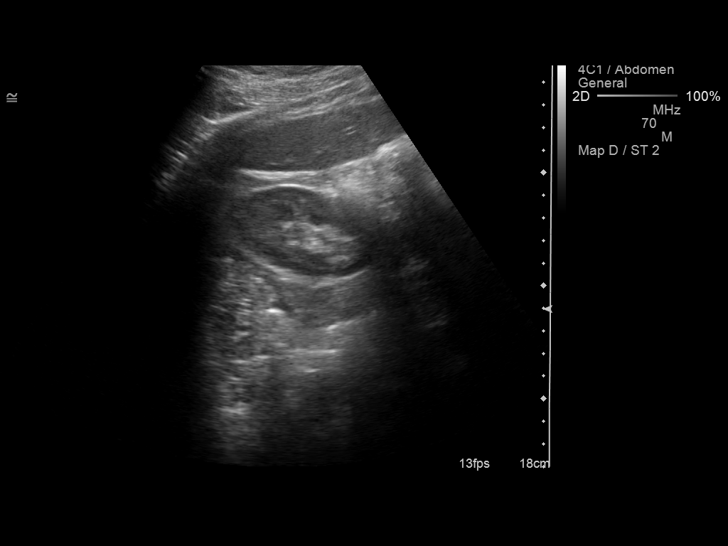
[im 63/108]
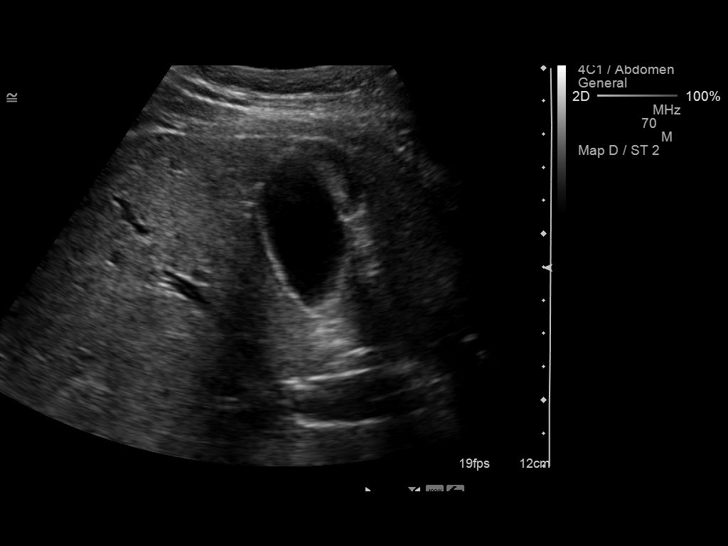
[im 72/108]
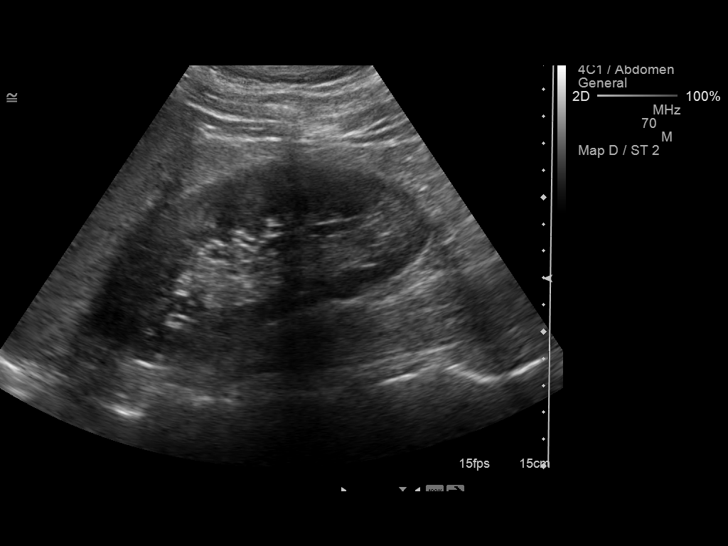
[im 81/108]
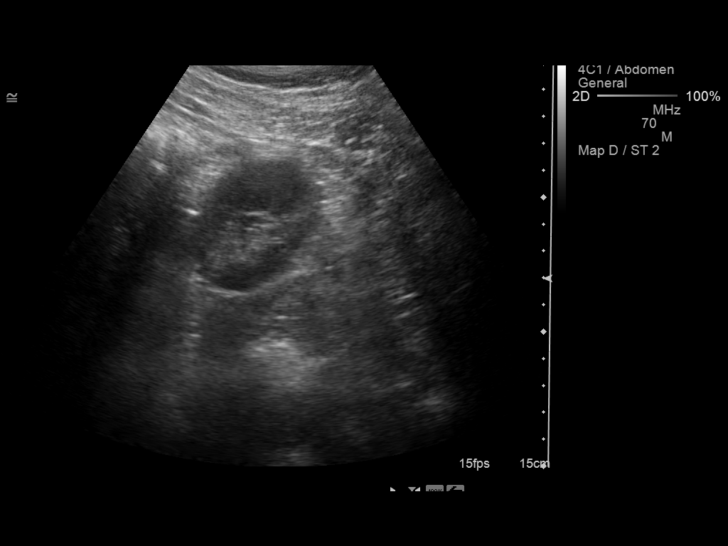
[im 90/108]
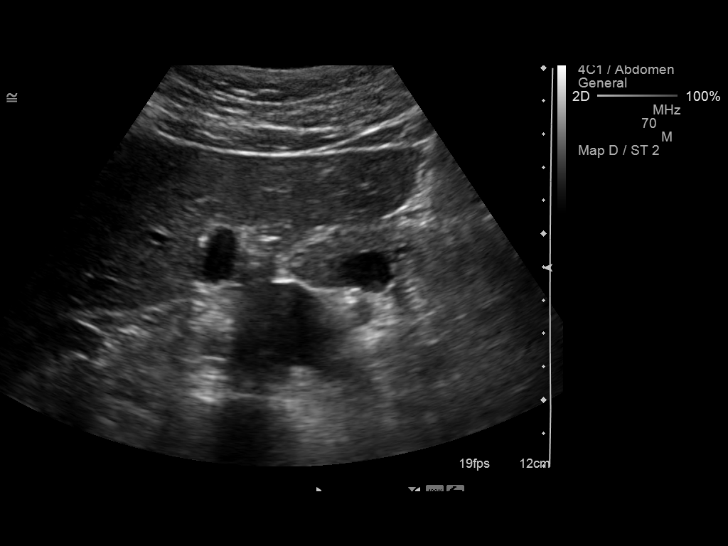
[im 99/108]
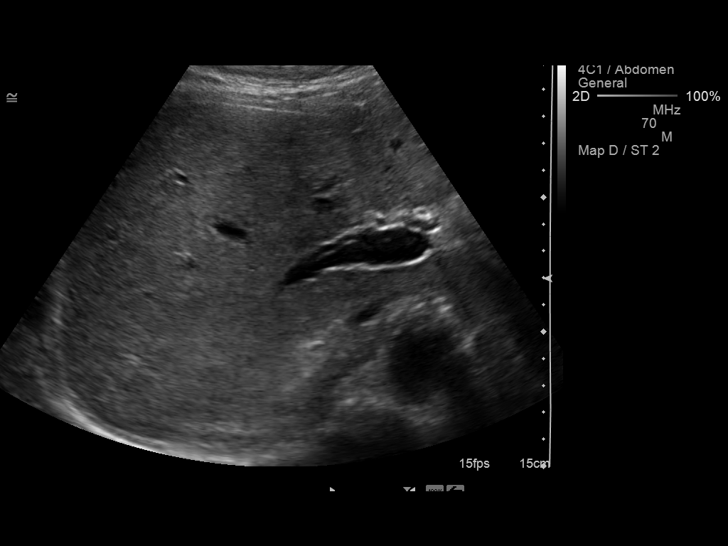
[im 108/108]
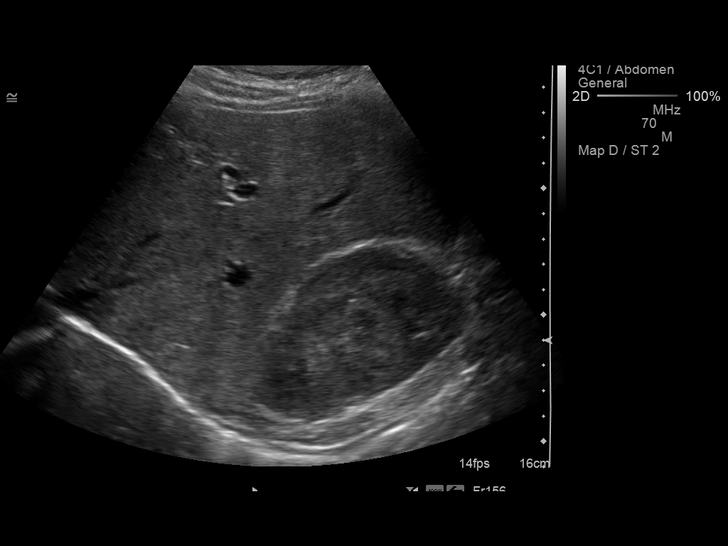

[13 of 25 positions shown; findings below may reference images not displayed]

FINDINGS: Gallbladder:  The gallbladder is well distended shows no evidence
for intraluminal stones or sludge.  No pericholecystic fluid or
gallbladder wall thickening is seen.  Evaluation for a sonographic
Murphy's sign is negative

Common bile duct:  Measures 2.9 mm in diameter and has a normal
appearance

Liver:  Is homogeneous in echotexture.  A circumscribed cystic area
is identified in the right lobe of the liver measuring 8 x 11 x 10
mm.  This demonstrates no peripheral enhancement and no worrisome
features sonographically.  No signs of intrahepatic ductal
dilatation are evident.  The hepatic capsule appears smooth.

IVC:  The proximal portion appears normal

Pancreas:  Is normal in size and echotexture

Spleen:  Has a sagittal length of 11.1 cm and homogeneous
parenchymal pattern

Right Kidney:  Demonstrates a sagittal length of 12.1 cm.  No focal
parenchymal abnormalities or signs of hydronephrosis are seen

Left Kidney:  Demonstrates a sagittal length of 13.3 cm.  No focal
parenchymal abnormalities or signs of hydronephrosis are seen

Abdominal aorta:  Has a maximal caliber of 2.6 cm with no
aneurysmal dilatation.  Graft other no intra-abdominal fluid is
noted
IMPRESSION: Benign appearing sub centimeter hepatic cyst.

Otherwise unremarkable abdominal ultrasound with no signs
suggestive of biliary obstruction.

## 2015-06-01 ENCOUNTER — Encounter: Payer: Self-pay | Admitting: Family Medicine

## 2015-06-01 ENCOUNTER — Ambulatory Visit (INDEPENDENT_AMBULATORY_CARE_PROVIDER_SITE_OTHER): Payer: 59 | Admitting: Family Medicine

## 2015-06-01 VITALS — BP 128/81 | HR 63 | Temp 98.8°F | Resp 18 | Ht 72.0 in | Wt 194.0 lb

## 2015-06-01 DIAGNOSIS — L02413 Cutaneous abscess of right upper limb: Secondary | ICD-10-CM | POA: Insufficient documentation

## 2015-06-01 MED ORDER — DOXYCYCLINE HYCLATE 100 MG PO CAPS: 100.0000 mg | ORAL_CAPSULE | Freq: Two times a day (BID) | ORAL | Status: DC

## 2015-06-01 NOTE — Progress Notes (Signed)
   Subjective:    Patient ID: Alan Rojas, male    DOB: November 28, 1965, 49 y.o.   MRN: [id number]  HPI Patient presents for acute office visit for swelling and redness of right forearm after trauma. Right forearm trauma: Patient states that on Labor Day, 8 days ago, he was clearing trees and brush with his family member and received an abrasion/laceration to his right forearm. He reports it developed a "blister" over the area and became painful, red and swollen. He took a pair of "clippers "and cut into the posterior and expressed a small amount of "yellowish" fluid. Patient is not a diabetic. He is not on blood thinners. He denies fevers or chills. He is a nonsmoker. No allergies.  Past Medical History  Diagnosis Date  . Tinnitus 04/15/2012   No Known Allergies Social History   Social History  . Marital Status: Married    Spouse Name: N/A  . Number of Children: N/A  . Years of Education: N/A   Occupational History  . Not on file.   Social History Main Topics  . Smoking status: Never Smoker   . Smokeless tobacco: Never Used  . Alcohol Use: No  . Drug Use: No  . Sexual Activity: Not on file   Other Topics Concern  . Not on file   Social History Narrative   Married, has 4 children (17, 71, 71, 65).   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  Wife has degree in education.   Relocated for [employer] approx 2006 to work for [employer].   No T/A/Ds.   Exercises: walks in neighborhood 3-4 times per week.   .Review of Systems Negative, with the exception of above mentioned in HPI     Objective:   Physical Exam BP 128/81 mmHg  Pulse 63  Temp(Src) 98.8 F (37.1 C) (Temporal)  Resp 18  Ht 6' (1.829 m)  Wt 194 lb (87.998 kg)  BMI 26.31 kg/m2  SpO2 98% Gen: Afebrile. No acute distress. Nontoxic in appearance. Skin: No rashes, purpura or petechiae. ~5 cm erythremic raised (1/2 golf ball) area, with central abrasion/lacertation. ~1-2 mm area of fluctuance. Neurovascularly  intact distally.      Assessment & Plan:  1. Abscess of arm, right - small area of abscess/fluctence right forearm.  - I&D area today, doxycycline BID - Wound care instructions - F/u 1 week, or sooner if increased pain, redness or drainage.   Procedure Note:  PROCEDURE NOTE: Incision and Drainage Performed by: Dr. Raoul Pitch Indication: abscess Anesthesia was obtained with 1 ml of 1% lidocaine with epi . The area was prepped in the usual sterile fashion. A number 11 scalpel was used to create a small 1-2 mm incision, abscess/drainage expressed. Return was 1 cc of pus-like fluid. Culture was not obtained. The site was not packed.  A dressing was placed over the site with triple antibiotic ointment. The patient tolerated the procedure well. Wound care instructions were given. Patient to follow up tomorrow.   Post-Procedure Diagnosis: forearm abscess after trauma.   Complications: None Estimated Blood Loss:  Minimal.

## 2015-06-01 NOTE — Patient Instructions (Signed)
Keep area clean and dry, wash with soap while showering. Take antibiotic until completion.  Follow up in 1 week, or sooner if area becomes more red, painful or drains pus-like material.   Cellulitis Cellulitis is an infection of the skin and the tissue beneath it. The infected area is usually red and tender. Cellulitis occurs most often in the arms and lower legs.  CAUSES  Cellulitis is caused by bacteria that enter the skin through cracks or cuts in the skin. The most common types of bacteria that cause cellulitis are staphylococci and streptococci. SIGNS AND SYMPTOMS   Redness and warmth.  Swelling.  Tenderness or pain.  Fever. DIAGNOSIS  Your health care provider can usually determine what is wrong based on a physical exam. Blood tests may also be done. TREATMENT  Treatment usually involves taking an antibiotic medicine. HOME CARE INSTRUCTIONS   Take your antibiotic medicine as directed by your health care provider. Finish the antibiotic even if you start to feel better.  Keep the infected arm or leg elevated to reduce swelling.  Apply a warm cloth to the affected area up to 4 times per day to relieve pain.  Take medicines only as directed by your health care provider.  Keep all follow-up visits as directed by your health care provider. SEEK MEDICAL CARE IF:   You notice red streaks coming from the infected area.  Your red area gets larger or turns dark in color.  Your bone or joint underneath the infected area becomes painful after the skin has healed.  Your infection returns in the same area or another area.  You notice a swollen bump in the infected area.  You develop new symptoms.  You have a fever. SEEK IMMEDIATE MEDICAL CARE IF:   You feel very sleepy.  You develop vomiting or diarrhea.  You have a general ill feeling (malaise) with muscle aches and pains. MAKE SURE YOU:   Understand these instructions.  Will watch your condition.  Will get help  right away if you are not doing well or get worse. Document Released: 06/14/2005 Document Revised: 01/19/2014 Document Reviewed: 11/20/2011 [facility] Patient Information 2015 [place], [place]. This information is not intended to replace advice given to you by your health care provider. Make sure you discuss any questions you have with your health care provider.

## 2015-06-01 NOTE — Progress Notes (Signed)
Pre visit review using our clinic review tool, if applicable. No additional management support is needed unless otherwise documented below in the visit note. 

## 2015-06-08 ENCOUNTER — Encounter: Payer: Self-pay | Admitting: Family Medicine

## 2015-06-08 ENCOUNTER — Ambulatory Visit (INDEPENDENT_AMBULATORY_CARE_PROVIDER_SITE_OTHER): Payer: 59 | Admitting: Family Medicine

## 2015-06-08 VITALS — BP 110/71 | HR 61 | Temp 97.7°F | Resp 16 | Ht 72.0 in | Wt 194.0 lb

## 2015-06-08 DIAGNOSIS — L02413 Cutaneous abscess of right upper limb: Secondary | ICD-10-CM | POA: Diagnosis not present

## 2015-06-08 DIAGNOSIS — L259 Unspecified contact dermatitis, unspecified cause: Secondary | ICD-10-CM

## 2015-06-08 NOTE — Patient Instructions (Signed)

## 2015-06-08 NOTE — Progress Notes (Signed)
Pre visit review using our clinic review tool, if applicable. No additional management support is needed unless otherwise documented below in the visit note. 

## 2015-06-08 NOTE — Progress Notes (Signed)
   Subjective:    Patient ID: Alan Rojas, male    DOB: 1966-07-02, 49 y.o.   MRN: [id number]  HPI  Forearm abscess: Patient returns today for follow-up on his right forearm abscess. Patient states that the incision site is well-healed, nondraining. He continues to take the doxycycline twice a day. He does have a new area of blister like formation that he states was around the incision site and on his opposite forearm. He reports that a few days after the drainage he became itchy and swollen, with blisterlike formations developing. He was using purple Coban, and Neosporin as dressing. He has never used the purple command before, he has used Neosporin in the past.  Never smoker  Past Medical History  Diagnosis Date  . Tinnitus 04/15/2012   No Known Allergies  Review of Systems Negative, with the exception of above mentioned in HPI     Objective:   Physical Exam BP 110/71 mmHg  Pulse 61  Temp(Src) 97.7 F (36.5 C) (Oral)  Resp 16  Ht 6' (1.829 m)  Wt 194 lb (87.998 kg)  BMI 26.31 kg/m2  SpO2 98% Gen: Afebrile. No acute distress. Nontoxic appearance, well-developed, well-nourished Caucasian male. Skin: No erythema, very mild soft tissue swelling right forearm. Blisterlike formations distal to incision. Incision well-healed, no drainage, no erythema. Left forearm with healed small blister formations 6. Neurovascularly intact distally.     Assessment & Plan:  1. Abscess of arm, right - well healed, no signs of infection - Continue antibiotic until completion  2. Contact dermatitis - New contact dermatitis reaction distal to incision site, possibly from neosporin ointment or bandage (he used purple coban).  - Advised patient to use hydrocortisone cream, over blisters. If not improved or worsening by Friday he is to call in and I will call in Kenalog. Patient encouraged to follow-up on Friday if he sees any signs of infection redness or drainage.  Follow-up as needed

## 2015-07-20 DIAGNOSIS — M5416 Radiculopathy, lumbar region: Secondary | ICD-10-CM

## 2015-07-20 HISTORY — DX: Radiculopathy, lumbar region: M54.16

## 2015-08-17 ENCOUNTER — Telehealth: Payer: Self-pay | Admitting: Family Medicine

## 2015-08-17 NOTE — Telephone Encounter (Signed)
pt has a new patient appointment with you next week. His wife (who is a pt of yours) called back and states he is in extreme pain and wondering if you can get him in earlier Please advise

## 2015-08-18 NOTE — Telephone Encounter (Signed)
Spoke to pt's wife. Scheduled pt for tomorrow @ 345p.

## 2015-08-19 ENCOUNTER — Ambulatory Visit (INDEPENDENT_AMBULATORY_CARE_PROVIDER_SITE_OTHER)
Admission: RE | Admit: 2015-08-19 | Discharge: 2015-08-19 | Disposition: A | Discharge status: Home / Self Care | Payer: 59 | Source: Ambulatory Visit | Attending: Family Medicine | Admitting: Family Medicine

## 2015-08-19 ENCOUNTER — Encounter: Payer: Self-pay | Admitting: Family Medicine

## 2015-08-19 ENCOUNTER — Ambulatory Visit (INDEPENDENT_AMBULATORY_CARE_PROVIDER_SITE_OTHER): Payer: 59 | Admitting: Family Medicine

## 2015-08-19 VITALS — BP 122/74 | HR 64 | Ht 72.0 in | Wt 191.0 lb

## 2015-08-19 DIAGNOSIS — M545 Low back pain, unspecified: Secondary | ICD-10-CM

## 2015-08-19 DIAGNOSIS — M5416 Radiculopathy, lumbar region: Secondary | ICD-10-CM | POA: Diagnosis not present

## 2015-08-19 MED ORDER — KETOROLAC TROMETHAMINE 60 MG/2ML IM SOLN
60.0000 mg | Freq: Once | INTRAMUSCULAR | Status: AC
  Administered 2015-08-19: 60 mg via INTRAMUSCULAR

## 2015-08-19 MED ORDER — GABAPENTIN 100 MG PO CAPS: 200.0000 mg | ORAL_CAPSULE | Freq: Every day | ORAL | Status: DC

## 2015-08-19 MED ORDER — METHYLPREDNISOLONE ACETATE 80 MG/ML IJ SUSP
80.0000 mg | Freq: Once | INTRAMUSCULAR | Status: AC
  Administered 2015-08-19: 80 mg via INTRAMUSCULAR

## 2015-08-19 MED ORDER — PREDNISONE 50 MG PO TABS: 50.0000 mg | ORAL_TABLET | Freq: Every day | ORAL | Status: DC

## 2015-08-19 NOTE — Patient Instructions (Signed)
Nice to meet you Ice 20 minutes 3-5 times daily. Usually after activity and before bed. 2 injections today Prednisone daily starting tomorrow for 5 days Gabapentin 200mg  at night and can increase to 300mg   Keep moving just no heavy lifting.  See me again in the next weeks.

## 2015-08-19 NOTE — Progress Notes (Signed)
Pre visit review using our clinic review tool, if applicable. No additional management support is needed unless otherwise documented below in the visit note. 

## 2015-08-19 NOTE — Assessment & Plan Note (Signed)
I do believe the patient is having more of an L5 nerve root compression secondary to his findings. There is a possibility is also of S1. Patient was given 2 injections today. Patient also started on prednisone. Gabapentin at night. Patient will not do any exercises at this time but an icing protocol. I would like patient to continue to move though. I'll see him back in 3 weeks. If doing better we will consider manipulation as well as either formal physical therapy or home exercises pending on findings. X-rays ordered today. If worsening symptoms patient knows to seek medical attention immediately. Patient makes no improvement advance imaging may also be warranted.

## 2015-08-19 NOTE — Progress Notes (Signed)
[facility] [place], [place] [address] Phone: [phone] Subjective:    I'm seeing this patient by the request  of:  MCGOWEN,PHILIP H, MD   CC: Right sided back pain  [id number] Alan Rojas is a 49 y.o. male coming in with complaint of right-sided back pain. Patient states that this is been going on for 3 months but has severely gotten worse over the course last 2 weeks. Patient started with right-sided back pain that seemed to be intermittently giving him trouble. Then seemed to be constant. Was responding to anti-inflammatories. Then started having posterior radiculopathy going down the leg. Patient states now his foot seems to be now almost constantly over the course last 3 weeks. States that he is having decreasing range of motion of the back. Denies any fever, chills, or any abnormal weight loss. Denies any recent illnesses. Had a very similar presentation in 2013 that did get better with physical therapy. Patient states that this one seems to be worse. Rates the severity of pain is 8 out of 10. When patient does have the shooting pain down the leg it as 9 out of 10. States that it is becoming very uncomfortable at night. Still able to do daily activities but is having difficulty even sitting in one position for longer than 10-15 minutes.  Past Medical History  Diagnosis Date  . Tinnitus 04/15/2012   No past surgical history on file. Social History  Substance Use Topics  . Smoking status: Never Smoker   . Smokeless tobacco: Never Used  . Alcohol Use: No   No Known Allergies Family History  Problem Relation Age of Onset  . Hypertension Mother   . Hypertension Maternal Grandmother   . Prostate cancer Maternal Grandfather         Past medical history, social, surgical and family history all reviewed in electronic medical record.   Review of Systems: No headache, visual changes, nausea, vomiting, diarrhea, constipation,  dizziness, abdominal pain, skin rash, fevers, chills, night sweats, weight loss, swollen lymph nodes, body aches, joint swelling, muscle aches, chest pain, shortness of breath, mood changes.   Objective Blood pressure 122/74, pulse 64, height 6' (1.829 m), weight 191 lb (86.637 kg), SpO2 98 %.  General: No apparent distress alert and oriented x3 mood and affect normal, dressed appropriately.  HEENT: Pupils equal, extraocular movements intact  Respiratory: Patient's speak in full sentences and does not appear short of breath  Cardiovascular: No lower extremity edema, non tender, no erythema  Skin: Warm dry intact with no signs of infection or rash on extremities or on axial skeleton.  Abdomen: Soft nontender  Neuro: Cranial nerves II through XII are intact, neurovascularly intact in all extremities with 2+ DTRs and 2+ pulses.  Lymph: No lymphadenopathy of posterior or anterior cervical chain or axillae bilaterally.  Gait normal with good balance and coordination.  MSK:  Non tender with full range of motion and good stability and symmetric strength and tone of shoulders, elbows, wrist, hip, knee and ankles bilaterally.  Back Exam:  Inspection: Unremarkable  Motion: Flexion 15 deg, Extension 25 deg, Side Bending to 25 deg bilaterally,  Rotation to 25 deg bilaterally  SLR laying: Severely positive right side XSLR laying: Positive on right Palpable tenderness: Tender over the paraspinal musculature of the lumbar spine on the right side mostly over L4-L5. FABER: negative. Sensory change: Gross sensation intact to all lumbar and sacral dermatomes.  Reflexes: 2+ at both patellar tendons,  2+ at achilles tendons, Babinski's downgoing.  Strength at foot  Plantar-flexion: 5/5 Dorsi-flexion: 5/5 Eversion: 5/5 Inversion: 5/5  Leg strength  Quad: 5/5 Hamstring: 5/5 Hip flexor: 5/5 Hip abductors: 4/5 but symmetric Gait unremarkable.    Impression and Recommendations:     This case required medical  decision making of moderate complexity.

## 2015-08-25 ENCOUNTER — Ambulatory Visit: Payer: 59 | Admitting: Family Medicine

## 2015-08-26 ENCOUNTER — Ambulatory Visit (INDEPENDENT_AMBULATORY_CARE_PROVIDER_SITE_OTHER): Payer: 59 | Admitting: Family Medicine

## 2015-08-26 ENCOUNTER — Encounter: Payer: Self-pay | Admitting: Family Medicine

## 2015-08-26 VITALS — BP 132/80 | HR 61 | Wt 192.0 lb

## 2015-08-26 DIAGNOSIS — M5416 Radiculopathy, lumbar region: Secondary | ICD-10-CM | POA: Diagnosis not present

## 2015-08-26 MED ORDER — TIZANIDINE HCL 4 MG PO TABS: 4.0000 mg | ORAL_TABLET | Freq: Three times a day (TID) | ORAL | Status: DC | PRN

## 2015-08-26 MED ORDER — MELOXICAM 15 MG PO TABS: 15.0000 mg | ORAL_TABLET | Freq: Every day | ORAL | Status: DC

## 2015-08-26 NOTE — Patient Instructions (Signed)
Good to see you Sorry we are not perfect Meloxicam daily for 10 days then as needed Zanaflex up to 3 times during the day Conitnue the gabapentin at night After the MRI I will send you a message in my chart and tell you next step If worsening symptoms or bowel or bladder trouble go to emergency room.

## 2015-08-26 NOTE — Assessment & Plan Note (Signed)
Continues to have lumbar radiculopathy. Patient continues to have pain in the back as well as a positive straight leg test with numbness of the foot. Patient also has even decreased seeking deep tendon reflexes. I am adamant that patient does need to advance imaging at this time. Patient knows if any worsening symptoms to go to the emergency room room immediately. Patient has tried to change his working position and doing more standing. States that sitting seems to make it worse. Has not had issues for the last 2 weeks. Affecting his daily activities. Denies any weakness at this time. He knows if any weakness occurs or any bowel or bladder incontinence to seek medical attention immediately. Depending on findings we'll either consider epidural for possible surgical intervention. Patient is in agreement with this plan.  Spent  25 minutes with patient face-to-face and had greater than 50% of counseling including as described above in assessment and plan.

## 2015-08-26 NOTE — Progress Notes (Signed)
[facility] [address] [place], [place] [address] Phone: [phone] Subjective:    I'm seeing this patient by the request  of:  MCGOWEN,PHILIP H, MD   CC: Right sided back pain follow up  [id number] Alan Rojas is a 49 y.o. male coming in with complaint of right-sided back pain. Patient states that this is been going on for 3 months but has severely gotten worse over the course last 2 weeks. Patient was seen last week and was diagnosed with a lumbar radiculopathy. Patient was treated with prednisone as well as gabapentin home exercises and icing. Patient states that while he was on the prednisone he is approximately 20-30% better. Now that he has been 2 days without the prednisone the pain is back at the same level as it was previously. Continues to have radicular symptoms going down his leg. Notices that the numbness seems to be expanding from the bottom of his foot to his 2 toes on the lateral aspect as well as up the posterior aspect of his calf. Not as any bowel or bladder incontinence. States that the gabapentin does help him sleep somewhat.  X-rays were ordered at last exam show mild disc degeneration at L4-L5 and L3-L4. Otherwise fairly unremarkable.  Past Medical History  Diagnosis Date  . Tinnitus 04/15/2012  . Lumbar radiculopathy 07/2015   History reviewed. No pertinent past surgical history. Social History  Substance Use Topics  . Smoking status: Never Smoker   . Smokeless tobacco: Never Used  . Alcohol Use: No   No Known Allergies Family History  Problem Relation Age of Onset  . Hypertension Mother   . Hypertension Maternal Grandmother   . Prostate cancer Maternal Grandfather         Past medical history, social, surgical and family history all reviewed in electronic medical record.   Review of Systems: No headache, visual changes, nausea, vomiting, diarrhea, constipation, dizziness, abdominal pain, skin rash, fevers,  chills, night sweats, weight loss, swollen lymph nodes, body aches, joint swelling, muscle aches, chest pain, shortness of breath, mood changes.   Objective Blood pressure 132/80, pulse 61, weight 192 lb (87.091 kg), SpO2 97 %.  General: No apparent distress alert and oriented x3 mood and affect normal, dressed appropriately. Patient did not want to sit for the exam. HEENT: Pupils equal, extraocular movements intact  Respiratory: Patient's speak in full sentences and does not appear short of breath  Cardiovascular: No lower extremity edema, non tender, no erythema  Skin: Warm dry intact with no signs of infection or rash on extremities or on axial skeleton.  Abdomen: Soft nontender  Neuro: Cranial nerves II through XII are intact, neurovascularly intact in all extremities with 2+ DTRs and 2+ pulses.  Lymph: No lymphadenopathy of posterior or anterior cervical chain or axillae bilaterally.  Gait normal with good balance and coordination.  MSK:  Non tender with full range of motion and good stability and symmetric strength and tone of shoulders, elbows, wrist, hip, knee and ankles bilaterally.  Back Exam:  Inspection: Unremarkable  Motion: Flexion 15 deg, Extension 25 deg, Side Bending to 25 deg bilaterally,  Rotation to 25 deg bilaterally  SLR laying: Severely positive right side that even 20 XSLR laying: Positive on right Palpable tenderness: Possibly worsening tenderness over the paraspinal muscles future FABER: negative. Sensory change: Gross sensation intact to all lumbar and sacral dermatomes.  Patient now has reflexes are 1+ of the Achilles on the right side compared to  2+ on the contralateral side. Densities downgoing. Strength at foot  Plantar-flexion: 5/5 Dorsi-flexion: 5/5 Eversion: 5/5 Inversion: 5/5  Leg strength  Quad: 5/5 Hamstring: 5/5 Hip flexor: 5/5 Hip abductors: 4/5 but symmetric Gait unremarkable.    Impression and Recommendations:     This case required medical  decision making of moderate complexity.

## 2015-08-30 ENCOUNTER — Ambulatory Visit
Admission: RE | Admit: 2015-08-30 | Discharge: 2015-08-30 | Disposition: A | Discharge status: Home / Self Care | Payer: 59 | Source: Ambulatory Visit | Attending: Family Medicine | Admitting: Family Medicine

## 2015-08-30 ENCOUNTER — Encounter: Payer: Self-pay | Admitting: Family Medicine

## 2015-08-30 ENCOUNTER — Other Ambulatory Visit: Payer: Self-pay

## 2015-08-30 DIAGNOSIS — M5416 Radiculopathy, lumbar region: Secondary | ICD-10-CM

## 2015-09-08 ENCOUNTER — Ambulatory Visit
Admission: RE | Admit: 2015-09-08 | Discharge: 2015-09-08 | Disposition: A | Discharge status: Home / Self Care | Payer: 59 | Source: Ambulatory Visit | Attending: Family Medicine | Admitting: Family Medicine

## 2015-09-08 ENCOUNTER — Other Ambulatory Visit: Payer: Self-pay | Admitting: Family Medicine

## 2015-09-08 DIAGNOSIS — M5416 Radiculopathy, lumbar region: Secondary | ICD-10-CM

## 2015-09-08 MED ORDER — METHYLPREDNISOLONE ACETATE 40 MG/ML INJ SUSP (RADIOLOG
120.0000 mg | Freq: Once | INTRAMUSCULAR | Status: AC
  Administered 2015-09-08: 120 mg via EPIDURAL

## 2015-09-08 MED ORDER — IOHEXOL 180 MG/ML  SOLN
1.0000 mL | Freq: Once | INTRAMUSCULAR | Status: AC | PRN
  Administered 2015-09-08: 1 mL via EPIDURAL

## 2015-09-08 NOTE — Discharge Instructions (Signed)

## 2015-10-12 ENCOUNTER — Other Ambulatory Visit: Payer: Self-pay | Admitting: *Deleted

## 2015-10-12 DIAGNOSIS — M5416 Radiculopathy, lumbar region: Secondary | ICD-10-CM

## 2015-10-13 ENCOUNTER — Other Ambulatory Visit: Payer: Self-pay | Admitting: *Deleted

## 2015-10-13 DIAGNOSIS — M5416 Radiculopathy, lumbar region: Secondary | ICD-10-CM

## 2015-10-19 ENCOUNTER — Telehealth: Payer: Self-pay | Admitting: Family Medicine

## 2015-10-19 ENCOUNTER — Ambulatory Visit (INDEPENDENT_AMBULATORY_CARE_PROVIDER_SITE_OTHER): Payer: 59 | Admitting: Neurology

## 2015-10-19 DIAGNOSIS — M5416 Radiculopathy, lumbar region: Secondary | ICD-10-CM

## 2015-10-19 NOTE — Telephone Encounter (Signed)
Reviewed patient's EMG. Correspond with the S1 nerve root impingement on the right side. This corresponds perfectly with patient's MRI. Did get some mild improvement with the epidural but very minimal. Would repeat nerve root injection. Patient to discuss further. Patient would like to we can put in order.

## 2015-10-19 NOTE — Procedures (Signed)
[facility]  [address] [place]  [place], [place] [address] Tel: [phone] Fax:  [phone] Test Date:  10/19/2015  Patient: Alan Rojas DOB: 08/17/1966 Physician: Narda Amber, DO  Sex: Male Height: 5\' 11"  Ref Phys: Charlann Boxer, M.D.  ID#: [id number] Temp: 33.2C Technician: Jerilynn Mages. Dean   Patient Complaints: This is a 50 year old gentleman referred for evaluation of right-sided low back pain, radicular pain, and persistent numbness of the last two toes.  NCV & EMG Findings: Extensive electrodiagnostic testing of the right lower extremity shows: 1. Right sural and superficial peroneal sensory responses are within normal limits. 2. Right peroneal and tibial motor responses are within normal limits. 3. Right tibial H reflexes studies shows prolonged latency. 4. 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   Impression: 1. Subacute S1 radiculopathy affecting the right lower extremity, mild in degree electrically. 2. There is no evidence of a generalized sensorimotor polyneuropathy or sciatic mononeuropathy affecting the right lower extremity.   ___________________________ Narda Amber, DO    Nerve Conduction Studies Anti Sensory Summary Table   Site NR Peak (ms) Norm Peak (ms) P-T Amp (V) Norm P-T Amp  Right Sup Peroneal Anti Sensory (Ant Lat Mall)  33.2C  12 cm    2.7 <4.5 15.3 >5  Right Sural Anti Sensory (Lat Mall)  Calf    4.0 <4.5 13.9 >5   Motor Summary Table   Site NR Onset (ms) Norm Onset (ms) O-P Amp (mV) Norm O-P Amp Site1 Site2 Delta-0 (ms) Dist (cm) Vel (m/s) Norm Vel (m/s)  Right Peroneal Motor (Ext Dig Brev)  Ankle    3.8 <5.5 8.2 >3 B Fib Ankle 6.4 33.0 52 >40  B Fib    10.2  7.5  Poplt B Fib 1.8 10.0 56 >40  Poplt    12.0  7.4         Right Tibial Motor (Abd Hall Brev)  Ankle    3.6 <6.0 16.6 >8 Knee Ankle 8.5 40.0 47 >40  Knee     12.1  9.3          H Reflex Studies   NR H-Lat (ms) Lat Norm (ms) L-R H-Lat (ms)  Right Tibial (Gastroc)     35.78 <35    EMG   Side Muscle Ins Act Fibs Psw Fasc Number Recrt Dur Dur. Amp Amp. Poly Poly. Comment  Right AntTibialis Nml Nml Nml Nml Nml Nml Nml Nml Nml Nml Nml Nml N/A  Right Gastroc Nml 1+ Nml Nml 1- Mod-R Few 1+ Few 1+ Nml Nml N/A  Right Flex Dig Long Nml Nml Nml Nml Nml Nml Nml Nml Nml Nml Nml Nml N/A  Right RectFemoris Nml Nml Nml Nml Nml Nml Nml Nml Nml Nml Nml Nml N/A  Right GluteusMax Nml Nml Nml Nml 1- Mod-R Few 1+ Nml Nml Nml Nml N/A  Right Lumbo Parasp Low Nml Nml Nml Nml NE - - - - - - - N/A  Right BicepsFemS Nml Nml Nml Nml 1- Mod-R Few 1+ Nml Nml Nml Nml N/A      Waveforms:

## 2015-10-20 ENCOUNTER — Encounter: Payer: Self-pay | Admitting: Family Medicine

## 2015-10-27 ENCOUNTER — Encounter: Payer: Self-pay | Admitting: Family Medicine

## 2015-10-27 ENCOUNTER — Ambulatory Visit (INDEPENDENT_AMBULATORY_CARE_PROVIDER_SITE_OTHER): Payer: 59 | Admitting: Family Medicine

## 2015-10-27 VITALS — BP 128/82 | HR 74 | Ht 72.0 in | Wt 190.0 lb

## 2015-10-27 DIAGNOSIS — M5416 Radiculopathy, lumbar region: Secondary | ICD-10-CM | POA: Diagnosis not present

## 2015-10-27 NOTE — Progress Notes (Signed)
Pre visit review using our clinic review tool, if applicable. No additional management support is needed unless otherwise documented below in the visit note. 

## 2015-10-27 NOTE — Assessment & Plan Note (Signed)
Patient's symptoms, MRI, EMG and response to epidural all point to an S1 nerve root impingement. Patient has failed all other conservative therapy. I do think that patient with having chronic numbness at this point as well as still some mild decrease in Achilles reflex. Patient will be referred to neurosurgery for further evaluation and likely surgical management. Encourage him to continue same medications.  Spent  25 minutes with patient face-to-face and had greater than 50% of counseling including as described above in assessment and plan.

## 2015-10-27 NOTE — Patient Instructions (Signed)
Verbal traction was given

## 2015-10-27 NOTE — Progress Notes (Signed)
[facility] [place],  [address] Phone: [phone] Subjective:    I'm seeing this patient by the request  of:  MCGOWEN,PHILIP H, MD   CC: Right sided back pain follow up  [id number] Alan Rojas is a 50 y.o. male coming in with complaint of right-sided back pain. Patient was having such significant pain and patient was sent for an MRI. Showed the patient did have a right-sided S1 nerve root impingement. EMG also confirmed this. Patient was given an epidural as well as a nerve root block. Patient states that he only received 1, notes stated he received both. Patient states that it did help and is better than what it was but continues to have pain going down the leg sometimes as well as constant numbness in his toes mostly on the plantar aspect of the foot. Patient states that it is becoming very difficult to do daily activities sometimes. States that sitting is better than standing but any flexion causes worsening symptoms.  X-rays were ordered at last exam show mild disc degeneration at L4-L5 and L3-L4. Otherwise fairly unremarkable.  Past Medical History  Diagnosis Date  . Tinnitus 04/15/2012  . Lumbar radiculopathy 07/2015   No past surgical history on file. Social History  Substance Use Topics  . Smoking status: Never Smoker   . Smokeless tobacco: Never Used  . Alcohol Use: No   No Known Allergies Family History  Problem Relation Age of Onset  . Hypertension Mother   . Hypertension Maternal Grandmother   . Prostate cancer Maternal Grandfather         Past medical history, social, surgical and family history all reviewed in electronic medical record.   Review of Systems: No headache, visual changes, nausea, vomiting, diarrhea, constipation, dizziness, abdominal pain, skin rash, fevers, chills, night sweats, weight loss, swollen lymph nodes, body aches, joint swelling, muscle aches, chest pain, shortness of breath,  mood changes.   Objective Blood pressure 128/82, pulse 74, height 6' (1.829 m), weight 190 lb (86.183 kg), SpO2 96 %.  General: No apparent distress alert and oriented x3 mood and affect normal, dressed appropriately. Patient did not want to sit for the exam. HEENT: Pupils equal, extraocular movements intact  Respiratory: Patient's speak in full sentences and does not appear short of breath  Cardiovascular: No lower extremity edema, non tender, no erythema  Skin: Warm dry intact with no signs of infection or rash on extremities or on axial skeleton.  Abdomen: Soft nontender  Neuro: Cranial nerves II through XII are intact, neurovascularly intact in all extremities with 2+ DTRs and 2+ pulses.  Lymph: No lymphadenopathy of posterior or anterior cervical chain or axillae bilaterally.  Gait normal with good balance and coordination.  MSK:  Non tender with full range of motion and good stability and symmetric strength and tone of shoulders, elbows, wrist, hip, knee and ankles bilaterally.  Back Exam:  Inspection: Unremarkable  Motion: Flexion 15 deg, Extension 25 deg, Side Bending to 25 deg bilaterally,  Rotation to 25 deg bilaterally  SLR laying: Severely positive right side that even 25 XSLR laying: Positive on right Palpable tenderness: Worsening tenderness on the paraspinal musculature. FABER: negative. Sensory change: Gross sensation intact to all lumbar and sacral dermatomes.  Patient now has reflexes are 1+ of the Achilles on the right side compared to 2+ on the contralateral side. Densities downgoing. Strength at foot  Plantar-flexion: 5/5 Dorsi-flexion: 5/5 Eversion: 5/5 Inversion: 5/5  Leg strength  Quad: 5/5 Hamstring: 5/5 Hip flexor: 5/5 Hip abductors: 4/5 but symmetric Gait unremarkable.    Impression and Recommendations:     This case required medical decision making of moderate complexity.

## 2015-10-28 ENCOUNTER — Ambulatory Visit: Payer: 59 | Admitting: Family Medicine

## 2015-10-28 ENCOUNTER — Encounter: Payer: Self-pay | Admitting: Family Medicine

## 2015-11-09 ENCOUNTER — Encounter: Payer: Self-pay | Admitting: Family Medicine

## 2015-11-09 ENCOUNTER — Ambulatory Visit (INDEPENDENT_AMBULATORY_CARE_PROVIDER_SITE_OTHER): Payer: 59 | Admitting: Family Medicine

## 2015-11-09 VITALS — BP 112/76 | HR 65 | Temp 98.4°F | Resp 20 | Wt 192.8 lb

## 2015-11-09 DIAGNOSIS — J0100 Acute maxillary sinusitis, unspecified: Secondary | ICD-10-CM | POA: Diagnosis not present

## 2015-11-09 MED ORDER — AMOXICILLIN-POT CLAVULANATE 875-125 MG PO TABS: 1.0000 | ORAL_TABLET | Freq: Two times a day (BID) | ORAL | Status: DC

## 2015-11-09 NOTE — Progress Notes (Signed)
Patient ID: Alan Rojas, male   DOB: [birth year], 50 y.o.   MRN: [id number]    Alan Rojas , Aug 04, 1966, 50 y.o., male MRN: [id number]  CC: headache Subjective: Pt presents for an acute OV with complaints of headache of 4 days duration.  Associated symptoms include ear pressure, facial pressure. Took 2 advil today and has not improved his headache until the last hour.  Pt denies fever, chills, nausea, vomit, diarrhea. + muscle aches, saturday, not currenlty UTD with flu and tdap No asthma history.   No Known Allergies Social History  Substance Use Topics  . Smoking status: Never Smoker   . Smokeless tobacco: Never Used  . Alcohol Use: No   Past Medical History  Diagnosis Date  . Tinnitus 04/15/2012  . Lumbar radiculopathy 07/2015   History reviewed. No pertinent past surgical history. Family History  Problem Relation Age of Onset  . Hypertension Mother   . Hypertension Maternal Grandmother   . Prostate cancer Maternal Grandfather      Medication List       This list is accurate as of: 11/09/15  3:30 PM.  Always use your most recent med list.               gabapentin 100 MG capsule  Commonly known as:  NEURONTIN  Take 2 capsules (200 mg total) by mouth at bedtime.     meloxicam 15 MG tablet  Commonly known as:  MOBIC  Take 1 tablet (15 mg total) by mouth daily.     tiZANidine 4 MG tablet  Commonly known as:  ZANAFLEX  Take 1 tablet (4 mg total) by mouth every 8 (eight) hours as needed for muscle spasms.        ROS: Negative, with the exception of above mentioned in HPI  Objective:  BP 112/76 mmHg  Pulse 65  Temp(Src) 98.4 F (36.9 C)  Resp 20  Wt 192 lb 12 oz (87.431 kg)  SpO2 97% Body mass index is 26.14 kg/(m^2). Gen: Afebrile. No acute distress. Nontoxic in appearance. Well developed well nourished. Pleasant caucasian male.  HENT: AT. [place]. Bilateral TM visualized, pink, full, fluid level.  No erythema or bulging. MMM, no oral lesions.  Bilateral nares with erythema and swelling. Throat with  mild erythema, no exudates. No cough or hoarseness on exam. TTP frontal and max sinus.  Eyes:Pupils Equal Round Reactive to light, Extraocular movements intact,  Conjunctiva without redness, discharge or icterus. Neck/lymp/endocrine: Supple, bilateral cervical lymphadenopathy CV: RRR  Chest: CTAB, no wheeze or crackles. Good air movement, normal resp effort.  Abd: Soft. NTND. BS present Skin: No rashes, purpura or petechiae.  Neuro: Normal gait. PERLA. EOMi. Alert. Oriented x3   Assessment/Plan: Alan Rojas is a 50 y.o. male present for acute OV for 1. Acute maxillary sinusitis, recurrence not specified - Flonase, mucinex, advil  - suspect possible influenza start that has lead into sinus infection. Past window to treat flu.  - amoxicillin-clavulanate (AUGMENTIN) 875-125 MG tablet; Take 1 tablet by mouth 2 (two) times daily.  Dispense: 20 tablet; Refill: 0   electronically signed by: Howard Pouch, DO  [place]

## 2015-11-09 NOTE — Patient Instructions (Signed)
Augmentin  Flonase, mucinex, advil   Sinusitis, Adult Sinusitis is redness, soreness, and inflammation of the paranasal sinuses. Paranasal sinuses are air pockets within the bones of your face. They are located beneath your eyes, in the middle of your forehead, and above your eyes. In healthy paranasal sinuses, mucus is able to drain out, and air is able to circulate through them by way of your nose. However, when your paranasal sinuses are inflamed, mucus and air can become trapped. This can allow bacteria and other germs to grow and cause infection. Sinusitis can develop quickly and last only a short time (acute) or continue over a long period (chronic). Sinusitis that lasts for more than 12 weeks is considered chronic. CAUSES Causes of sinusitis include:  Allergies.  Structural abnormalities, such as displacement of the cartilage that separates your nostrils (deviated septum), which can decrease the air flow through your nose and sinuses and affect sinus drainage.  Functional abnormalities, such as when the small hairs (cilia) that line your sinuses and help remove mucus do not work properly or are not present. SIGNS AND SYMPTOMS Symptoms of acute and chronic sinusitis are the same. The primary symptoms are pain and pressure around the affected sinuses. Other symptoms include:  Upper toothache.  Earache.  Headache.  Bad breath.  Decreased sense of smell and taste.  A cough, which worsens when you are lying flat.  Fatigue.  Fever.  Thick drainage from your nose, which often is green and may contain pus (purulent).  Swelling and warmth over the affected sinuses. DIAGNOSIS Your health care provider will perform a physical exam. During your exam, your health care provider may perform any of the following to help determine if you have acute sinusitis or chronic sinusitis:  Look in your nose for signs of abnormal growths in your nostrils (nasal polyps).  Tap over the affected  sinus to check for signs of infection.  View the inside of your sinuses using an imaging device that has a light attached (endoscope). If your health care provider suspects that you have chronic sinusitis, one or more of the following tests may be recommended:  Allergy tests.  Nasal culture. A sample of mucus is taken from your nose, sent to a lab, and screened for bacteria.  Nasal cytology. A sample of mucus is taken from your nose and examined by your health care provider to determine if your sinusitis is related to an allergy. TREATMENT Most cases of acute sinusitis are related to a viral infection and will resolve on their own within 10 days. Sometimes, medicines are prescribed to help relieve symptoms of both acute and chronic sinusitis. These may include pain medicines, decongestants, nasal steroid sprays, or saline sprays. However, for sinusitis related to a bacterial infection, your health care provider will prescribe antibiotic medicines. These are medicines that will help kill the bacteria causing the infection. Rarely, sinusitis is caused by a fungal infection. In these cases, your health care provider will prescribe antifungal medicine. For some cases of chronic sinusitis, surgery is needed. Generally, these are cases in which sinusitis recurs more than 3 times per year, despite other treatments. HOME CARE INSTRUCTIONS  Drink plenty of water. Water helps thin the mucus so your sinuses can drain more easily.  Use a humidifier.  Inhale steam 3-4 times a day (for example, sit in the bathroom with the shower running).  Apply a warm, moist washcloth to your face 3-4 times a day, or as directed by your health care provider.  Use saline nasal sprays to help moisten and clean your sinuses.  Take medicines only as directed by your health care provider.  If you were prescribed either an antibiotic or antifungal medicine, finish it all even if you start to feel better. SEEK IMMEDIATE  MEDICAL CARE IF:  You have increasing pain or severe headaches.  You have nausea, vomiting, or drowsiness.  You have swelling around your face.  You have vision problems.  You have a stiff neck.  You have difficulty breathing.   This information is not intended to replace advice given to you by your health care provider. Make sure you discuss any questions you have with your health care provider.   Document Released: 09/04/2005 Document Revised: 09/25/2014 Document Reviewed: 09/19/2011 Elsevier Interactive Patient Education [employer].

## 2016-07-03 ENCOUNTER — Encounter: Payer: Self-pay | Admitting: Family Medicine

## 2016-07-03 ENCOUNTER — Ambulatory Visit (INDEPENDENT_AMBULATORY_CARE_PROVIDER_SITE_OTHER): Payer: 59 | Admitting: Family Medicine

## 2016-07-03 DIAGNOSIS — J0100 Acute maxillary sinusitis, unspecified: Secondary | ICD-10-CM | POA: Diagnosis not present

## 2016-07-03 MED ORDER — AMOXICILLIN-POT CLAVULANATE 875-125 MG PO TABS: 1.0000 | ORAL_TABLET | Freq: Two times a day (BID) | ORAL | 0 refills | Status: DC

## 2016-07-03 NOTE — Patient Instructions (Signed)
Take mucinex D and add delsym to this. Try otc generic saline nasal spray and/or otc flonase or nasacort nasal spray.

## 2016-07-03 NOTE — Progress Notes (Signed)
Pre visit review using our clinic review tool, if applicable. No additional management support is needed unless otherwise documented below in the visit note. 

## 2016-07-03 NOTE — Progress Notes (Signed)
OFFICE VISIT  07/03/2016   CC:  Chief Complaint  Patient presents with  . Sinusitis    sinus pressure and pain, x 5 days.     HPI:    Patient is a 50 y.o. Caucasian male who presents for respiratory complaints. Started having ST 5 d/a.  This went away in 24 hours, then started having pressure in sinuses/face, upper teeth. Sudafed helps some.  Other OTC meds not helping. Lots of PND and coughing.  Cough is more problematic in night-time, feels a tickle.  No SOB, no wheezing. Eating and drinking well during this.  No fever.  Past Medical History:  Diagnosis Date  . Lumbar radiculopathy 07/2015  . Tinnitus 04/15/2012    Past Surgical History:  Procedure Laterality Date  . Lumbar epiduarl steroid injection      Outpatient Medications Prior to Visit  Medication Sig Dispense Refill  . amoxicillin-clavulanate (AUGMENTIN) 875-125 MG tablet Take 1 tablet by mouth 2 (two) times daily. (Patient not taking: Reported on 07/03/2016) 20 tablet 0  . gabapentin (NEURONTIN) 100 MG capsule Take 2 capsules (200 mg total) by mouth at bedtime. (Patient not taking: Reported on 07/03/2016) 60 capsule 3  . meloxicam (MOBIC) 15 MG tablet Take 1 tablet (15 mg total) by mouth daily. (Patient not taking: Reported on 07/03/2016) 30 tablet 0  . tiZANidine (ZANAFLEX) 4 MG tablet Take 1 tablet (4 mg total) by mouth every 8 (eight) hours as needed for muscle spasms. (Patient not taking: Reported on 07/03/2016) 30 tablet 2   No facility-administered medications prior to visit.     No Known Allergies  ROS As per HPI  PE: Blood pressure 134/85, pulse 73, temperature 97.6 F (36.4 C), temperature source Oral, resp. rate 18, weight 193 lb (87.5 kg), SpO2 97 %. VS: noted--normal. Gen: alert, NAD, NONTOXIC APPEARING. HEENT: eyes without injection, drainage, or swelling.  Ears: EACs clear, TMs with normal light reflex and landmarks.  Nose: Clear rhinorrhea, with some dried, crusty exudate adherent to mildly  injected mucosa.  No purulent d/c.  Mild diffuse paranasal sinus TTP.  No facial swelling.  Throat and mouth without focal lesion.  No pharyngial swelling, erythema, or exudate.   Neck: supple, no LAD.   LUNGS: CTA bilat, nonlabored resps.   CV: RRR, no m/r/g. EXT: no c/c/e SKIN: no rash  LABS:  none  IMPRESSION AND PLAN:  Acute maxillary sinusitis. Augmentin 875 bid x 10d. Get otc generic robitussin DM OR Mucinex DM and use as directed on the packaging for cough and congestion. Use otc generic saline nasal spray 2-3 times per day to irrigate/moisturize your nasal passages.  An After Visit Summary was printed and given to the patient.  FOLLOW UP: Return if symptoms worsen or fail to improve.  Signed:  Crissie Sickles, MD           07/03/2016

## 2016-09-12 ENCOUNTER — Encounter: Payer: Self-pay | Admitting: *Deleted

## 2016-10-14 DIAGNOSIS — Z23 Encounter for immunization: Secondary | ICD-10-CM | POA: Diagnosis not present

## 2016-11-04 IMAGING — DX DG LUMBAR SPINE COMPLETE 4+V
5 series · 5 of 5 positions shown · non-contrast
Comparison: None.

CLINICAL DATA: Chronic pain worsening over the last 3 weeks. Pain
is low back radiating to the right lower extremity.

EXAM:
LUMBAR SPINE - COMPLETE 4+ VIEW

[l-spine ap]
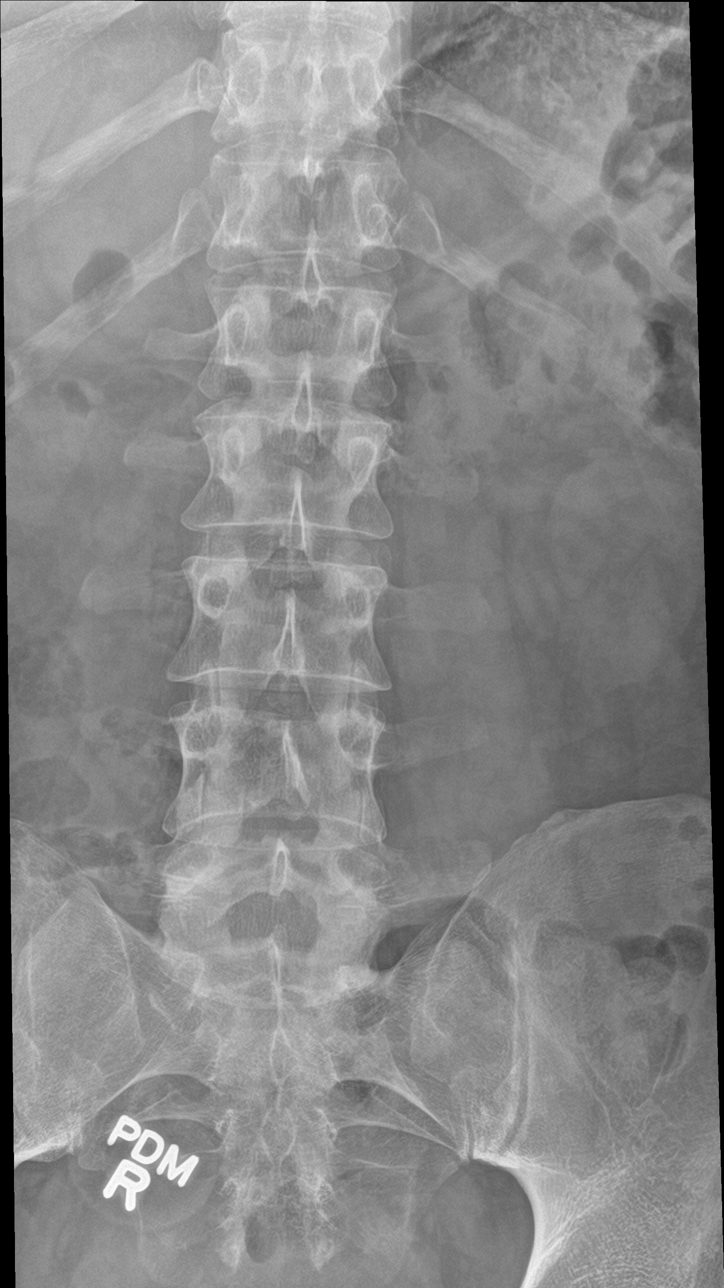

[l-spine obl (1 of 2)]
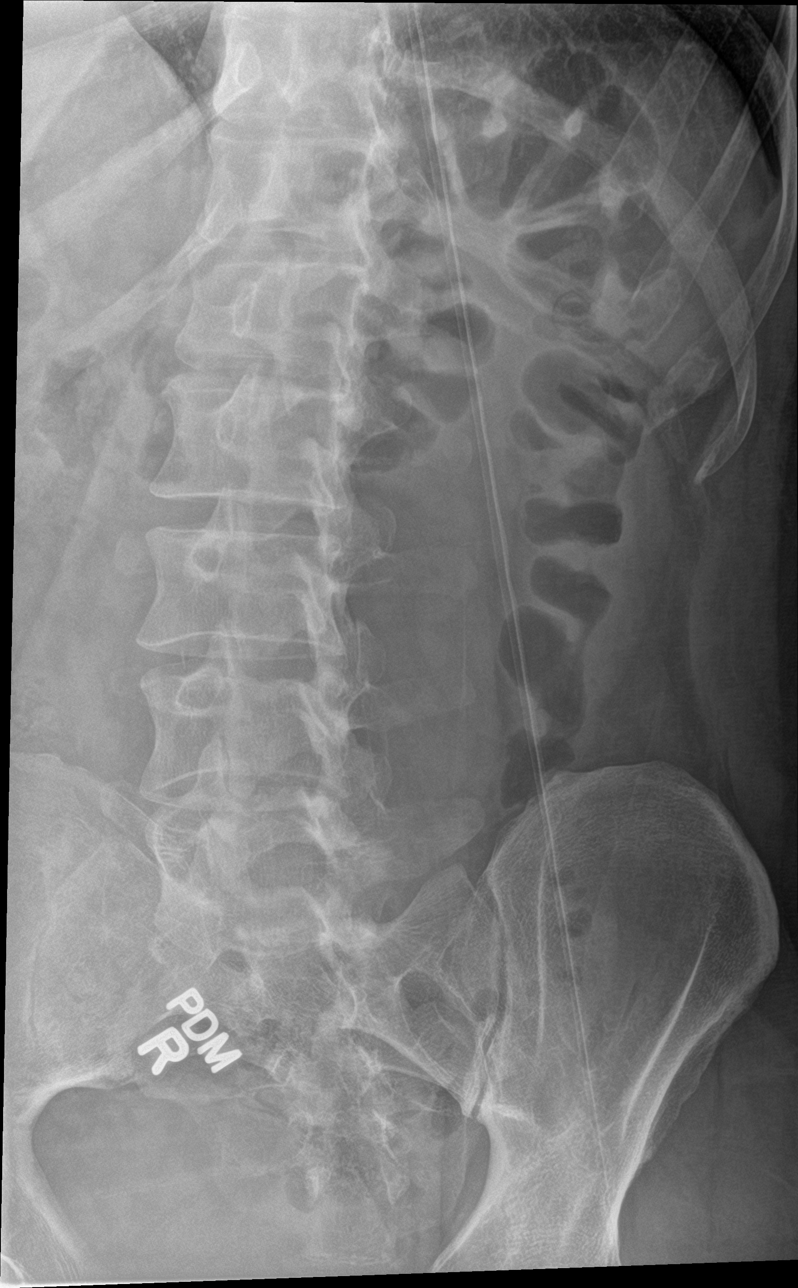

[l-spine obl (2 of 2)]
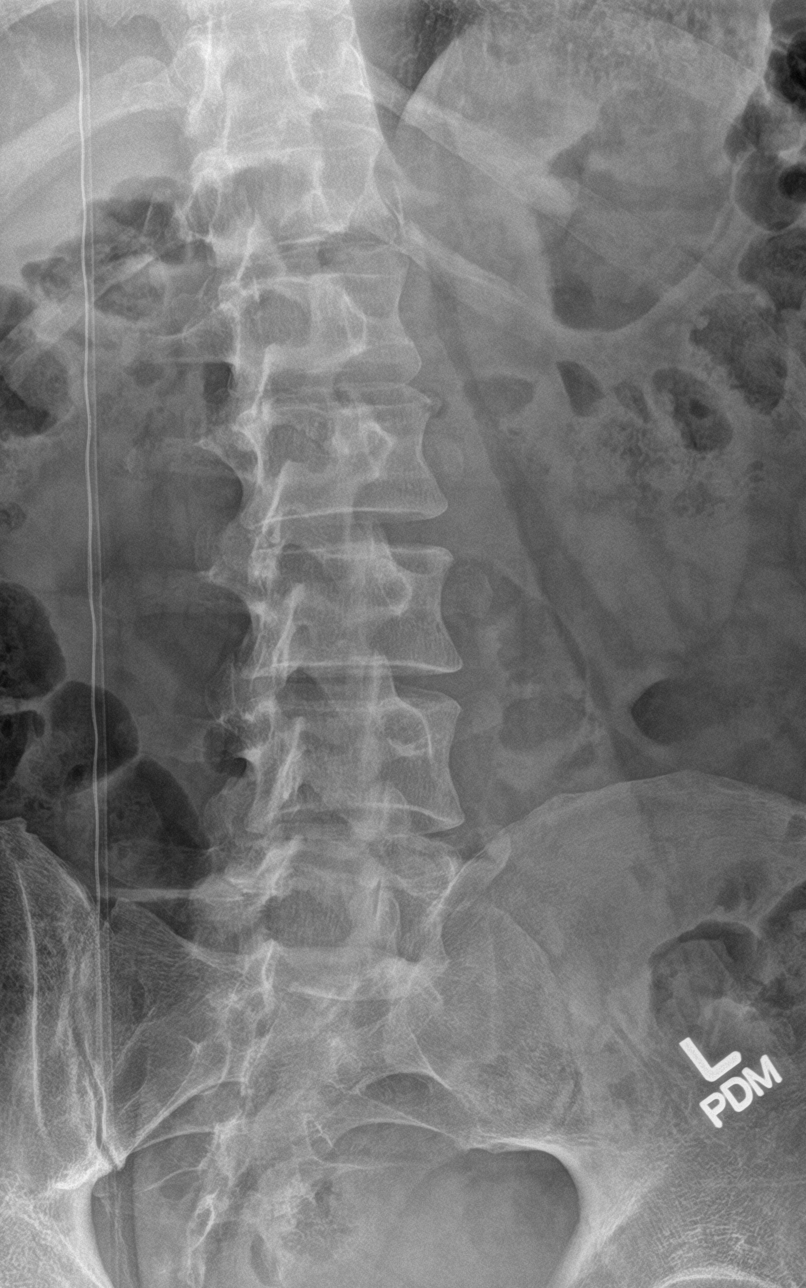

[l-spine lat]
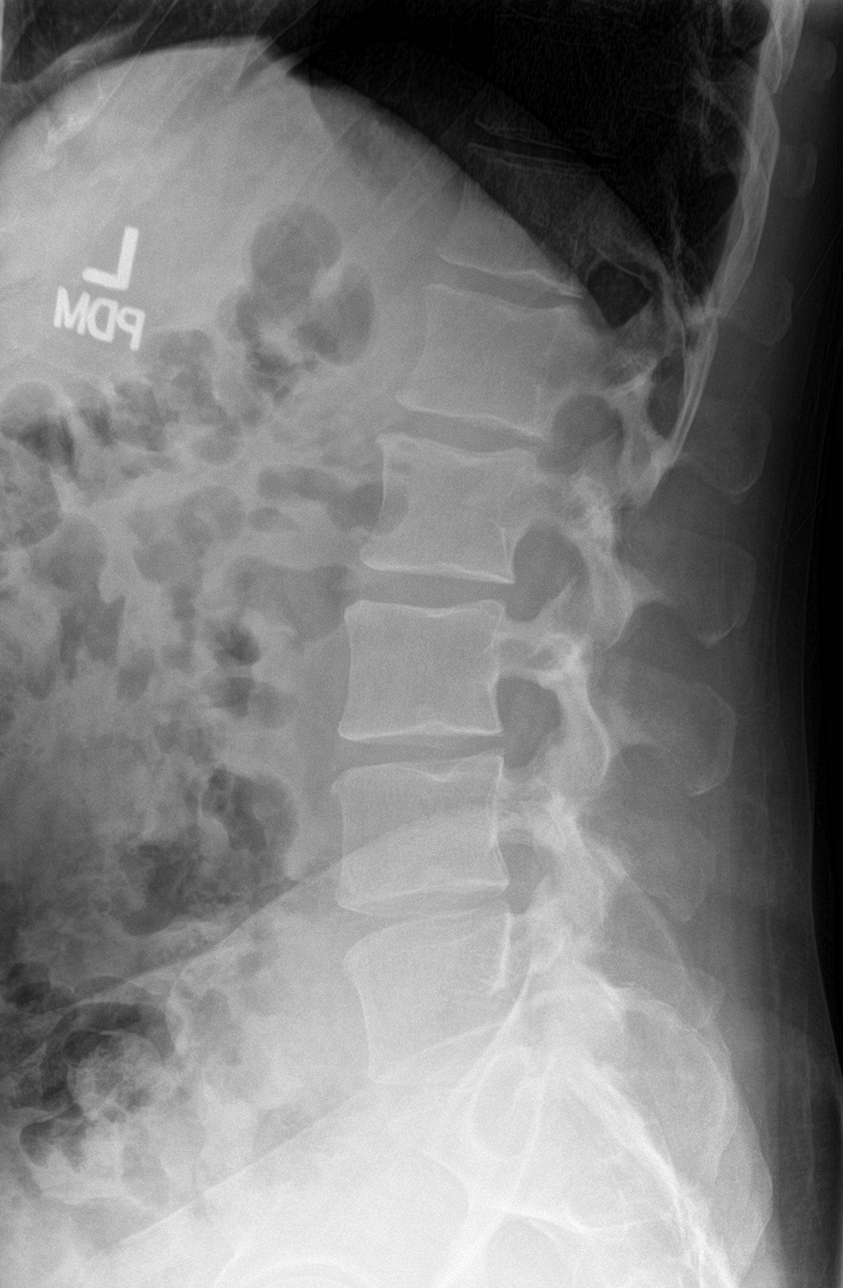

[l-spine spot]
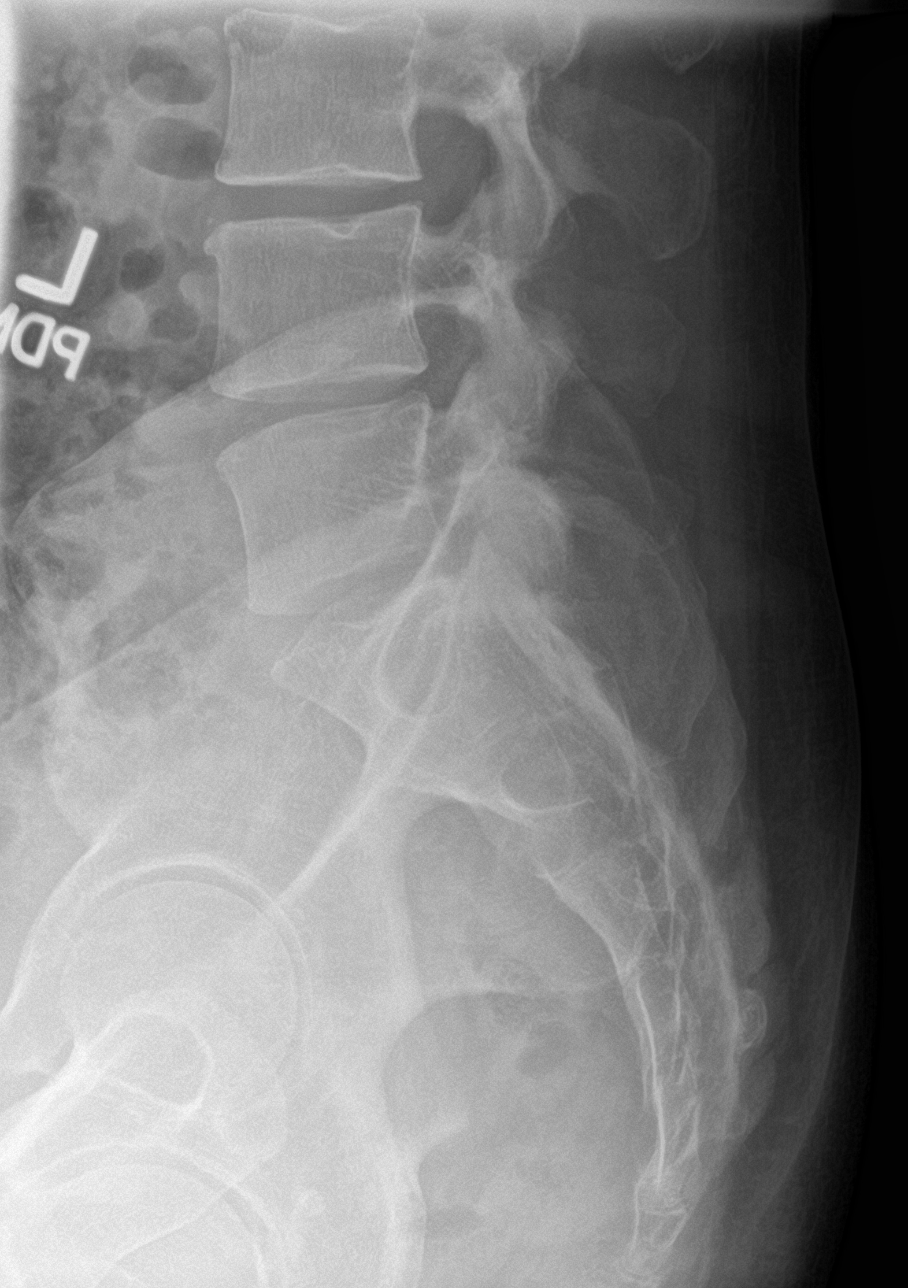

[5 of 5 positions shown; findings below may reference images not displayed]

FINDINGS: No fracture.  No spondylolisthesis.

Mild loss of disc height at L3-L4 and L4-L5. Facet joints are
relatively well preserved. Soft tissues are unremarkable.
IMPRESSION: 1. No fracture or acute finding.
2. Mild disc degenerative changes L3-L4 and L4-5.

## 2017-02-16 DIAGNOSIS — H00025 Hordeolum internum left lower eyelid: Secondary | ICD-10-CM | POA: Diagnosis not present

## 2017-03-05 ENCOUNTER — Encounter: Payer: Self-pay | Admitting: Family Medicine

## 2017-03-05 ENCOUNTER — Ambulatory Visit (INDEPENDENT_AMBULATORY_CARE_PROVIDER_SITE_OTHER): Payer: 59 | Admitting: Family Medicine

## 2017-03-05 VITALS — BP 122/75 | HR 56 | Temp 97.8°F | Resp 16 | Ht 71.5 in | Wt 194.2 lb

## 2017-03-05 DIAGNOSIS — Z125 Encounter for screening for malignant neoplasm of prostate: Secondary | ICD-10-CM

## 2017-03-05 DIAGNOSIS — Z0000 Encounter for general adult medical examination without abnormal findings: Secondary | ICD-10-CM

## 2017-03-05 DIAGNOSIS — Z23 Encounter for immunization: Secondary | ICD-10-CM | POA: Diagnosis not present

## 2017-03-05 DIAGNOSIS — Z1211 Encounter for screening for malignant neoplasm of colon: Secondary | ICD-10-CM | POA: Diagnosis not present

## 2017-03-05 LAB — CBC WITH DIFFERENTIAL/PLATELET
Basophils Absolute: 0 10*3/uL (ref 0.0–0.1)
Basophils Relative: 0.7 % (ref 0.0–3.0)
EOS PCT: 1.5 % (ref 0.0–5.0)
Eosinophils Absolute: 0.1 10*3/uL (ref 0.0–0.7)
HCT: 40.2 % (ref 39.0–52.0)
Hemoglobin: 13.6 g/dL (ref 13.0–17.0)
LYMPHS ABS: 1.3 10*3/uL (ref 0.7–4.0)
Lymphocytes Relative: 33.2 % (ref 12.0–46.0)
MCHC: 33.8 g/dL (ref 30.0–36.0)
MCV: 86.3 fl (ref 78.0–100.0)
MONO ABS: 0.4 10*3/uL (ref 0.1–1.0)
MONOS PCT: 10.4 % (ref 3.0–12.0)
NEUTROS ABS: 2.1 10*3/uL (ref 1.4–7.7)
NEUTROS PCT: 54.2 % (ref 43.0–77.0)
PLATELETS: 165 10*3/uL (ref 150.0–400.0)
RBC: 4.66 Mil/uL (ref 4.22–5.81)
RDW: 13.3 % (ref 11.5–15.5)
WBC: 3.9 10*3/uL — ABNORMAL LOW (ref 4.0–10.5)

## 2017-03-05 LAB — COMPREHENSIVE METABOLIC PANEL
ALT: 20 U/L (ref 0–53)
AST: 17 U/L (ref 0–37)
Albumin: 4.8 g/dL (ref 3.5–5.2)
Alkaline Phosphatase: 63 U/L (ref 39–117)
BILIRUBIN TOTAL: 0.4 mg/dL (ref 0.2–1.2)
BUN: 12 mg/dL (ref 6–23)
CO2: 32 meq/L (ref 19–32)
Calcium: 10.1 mg/dL (ref 8.4–10.5)
Chloride: 104 mEq/L (ref 96–112)
Creatinine, Ser: 0.96 mg/dL (ref 0.40–1.50)
GFR: 87.85 mL/min (ref >60.00)
GLUCOSE: 96 mg/dL (ref 70–99)
Potassium: 4.4 mEq/L (ref 3.5–5.1)
SODIUM: 141 meq/L (ref 135–145)
Total Protein: 6.8 g/dL (ref 6.0–8.3)

## 2017-03-05 LAB — LIPID PANEL
CHOL/HDL RATIO: 5
Cholesterol: 221 mg/dL — ABNORMAL HIGH (ref 0–200)
HDL: 45.6 mg/dL (ref >39.00)
LDL Cholesterol: 158 mg/dL — ABNORMAL HIGH (ref 0–99)
NONHDL: 174.92
Triglycerides: 84 mg/dL (ref 0.0–149.0)
VLDL: 16.8 mg/dL (ref 0.0–40.0)

## 2017-03-05 LAB — PSA: PSA: 0.75 ng/mL (ref 0.10–4.00)

## 2017-03-05 LAB — TSH: TSH: 1.76 u[IU]/mL (ref 0.35–4.50)

## 2017-03-05 NOTE — Patient Instructions (Signed)

## 2017-03-05 NOTE — Progress Notes (Signed)
Office Note 03/05/2017  CC:  Chief Complaint  Patient presents with  . Annual Exam    Pt is fasting.     HPI:  Alan Rojas is a 51 y.o.  male who is here for annual health maintenance exam. No acute complaints.  Eye exam about 18 mo ago: plans on getting repeat soon. Dental: preventative q 80mo. Exercise: walks about 1 mile qAM. Diet: not high fat, only occ snack food, no alcohol.   Past Medical History:  Diagnosis Date  . Lumbar radiculopathy 07/2015  . Tinnitus 04/15/2012    Past Surgical History:  Procedure Laterality Date  . Lumbar epiduarl steroid injection      Family History  Problem Relation Age of Onset  . Hypertension Mother   . Hypertension Maternal Grandmother   . Prostate cancer Maternal Grandfather     Social History   Social History  . Marital status: Married    Spouse name: N/A  . Number of children: N/A  . Years of education: N/A   Occupational History  . Not on file.   Social History Main Topics  . Smoking status: Never Smoker  . Smokeless tobacco: Never Used  . Alcohol use No  . Drug use: No  . Sexual activity: Not on file   Other Topics Concern  . Not on file   Social History Narrative   Married, has 4 children (17, 50, 37, 20).   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  Wife has degree in education.   Relocated for [place] [place] approx 2006 to work for [employer].   No T/A/Ds.   Exercises: walks in neighborhood 3-4 times per week.    Outpatient Medications Prior to Visit  Medication Sig Dispense Refill  . amoxicillin-clavulanate (AUGMENTIN) 875-125 MG tablet Take 1 tablet by mouth 2 (two) times daily. (Patient not taking: Reported on 03/05/2017) 20 tablet 0  . pseudoephedrine-guaifenesin (MUCINEX D) 60-600 MG 12 hr tablet Take 1 tablet by mouth every 12 (twelve) hours.     No facility-administered medications prior to visit.     No Known Allergies  ROS Review of Systems  Constitutional: Negative for appetite  change, chills, fatigue and fever.  HENT: Negative for congestion, dental problem, ear pain and sore throat.   Eyes: Negative for discharge, redness and visual disturbance.  Respiratory: Negative for cough, chest tightness, shortness of breath and wheezing.   Cardiovascular: Negative for chest pain, palpitations and leg swelling.  Gastrointestinal: Negative for abdominal pain, blood in stool, diarrhea, nausea and vomiting.  Genitourinary: Negative for difficulty urinating, dysuria, flank pain, frequency, hematuria and urgency.  Musculoskeletal: Negative for arthralgias, back pain, joint swelling, myalgias and neck stiffness.  Skin: Negative for pallor and rash.  Neurological: Negative for dizziness, speech difficulty, weakness and headaches.  Hematological: Negative for adenopathy. Does not bruise/bleed easily.  Psychiatric/Behavioral: Negative for confusion and sleep disturbance. The patient is not nervous/anxious.     PE; Blood pressure 122/75, pulse (!) 56, temperature 97.8 F (36.6 C), temperature source Oral, resp. rate 16, height 5' 11.5" (1.816 m), weight 194 lb 4 oz (88.1 kg), SpO2 98 %. Gen: Alert, well appearing.  Patient is oriented to person, place, time, and situation. AFFECT: pleasant, lucid thought and speech. ENT: Ears: EACs clear, normal epithelium.  TMs with good light reflex and landmarks bilaterally.  Eyes: no injection, icteris, swelling, or exudate.  EOMI, PERRLA. Nose: no drainage or turbinate edema/swelling.  No injection or focal lesion.  Mouth: lips without lesion/swelling.  Oral mucosa  pink and moist.  Dentition intact and without obvious caries or gingival swelling.  Oropharynx without erythema, exudate, or swelling.  Neck: supple/nontender.  No LAD, mass, or TM.  Carotid pulses 2+ bilaterally, without bruits. CV: RRR, no m/r/g.   LUNGS: CTA bilat, nonlabored resps, good aeration in all lung fields. ABD: soft, NT, ND, BS normal.  No hepatospenomegaly or mass.  No  bruits. EXT: no clubbing, cyanosis, or edema.  Musculoskeletal: no joint swelling, erythema, warmth, or tenderness.  ROM of all joints intact. Skin - no sores or suspicious lesions or rashes or color changes Rectal exam: negative without mass, lesions or tenderness, PROSTATE EXAM: smooth and symmetric without nodules or tenderness.  Pertinent labs:  Lab Results  Component Value Date   TSH 1.96 04/29/2012   Lab Results  Component Value Date   WBC 3.8 (L) 01/16/2013   HGB 12.9 (L) 01/16/2013   HCT 36.6 (L) 01/16/2013   MCV 83.4 01/16/2013   PLT 177 01/16/2013   Lab Results  Component Value Date   CREATININE 0.88 01/23/2013   BUN 14 01/23/2013   NA 141 01/23/2013   K 4.4 01/23/2013   CL 103 01/23/2013   CO2 27 01/23/2013   Lab Results  Component Value Date   ALT 95 (H) 01/23/2013   AST 17 01/23/2013   ALKPHOS 107 01/23/2013   BILITOT 0.5 01/23/2013   Lab Results  Component Value Date   CHOL 201 (H) 04/29/2012   Lab Results  Component Value Date   HDL 50.40 04/29/2012   No results found for: [facility] Lab Results  Component Value Date   TRIG 84.0 04/29/2012   Lab Results  Component Value Date   CHOLHDL 4 04/29/2012    ASSESSMENT AND PLAN:   Health maintenance exam: Reviewed age and gender appropriate health maintenance issues (prudent diet, regular exercise, health risks of tobacco and excessive alcohol, use of seatbelts, fire alarms in home, use of sunscreen).  Also reviewed age and gender appropriate health screening as well as vaccine recommendations. Vaccines: UTD--discussed shingrix--he'll get #1 here today. Fasting HP labs today. Prostate ca screening: DRE normal , PSA drawn today. Colon ca screening: GI referral made today.  An After Visit Summary was printed and given to the patient.  FOLLOW UP:  Return in about 1 year (around 03/05/2018) for annual CPE (fasting).  Signed:  Crissie Sickles, MD           03/05/2017

## 2017-03-05 NOTE — Addendum Note (Signed)
Addended by: Onalee Hua on: 03/05/2017 10:42 AM   Modules accepted: Orders

## 2017-03-06 ENCOUNTER — Encounter: Payer: Self-pay | Admitting: *Deleted

## 2017-03-23 ENCOUNTER — Encounter: Payer: Self-pay | Admitting: Gastroenterology

## 2017-04-18 DIAGNOSIS — H10413 Chronic giant papillary conjunctivitis, bilateral: Secondary | ICD-10-CM | POA: Diagnosis not present

## 2017-05-01 ENCOUNTER — Ambulatory Visit (INDEPENDENT_AMBULATORY_CARE_PROVIDER_SITE_OTHER): Payer: 59 | Admitting: Podiatry

## 2017-05-01 ENCOUNTER — Encounter: Payer: Self-pay | Admitting: Podiatry

## 2017-05-01 VITALS — BP 114/71 | HR 71 | Resp 16

## 2017-05-01 DIAGNOSIS — B353 Tinea pedis: Secondary | ICD-10-CM

## 2017-05-01 DIAGNOSIS — L603 Nail dystrophy: Secondary | ICD-10-CM

## 2017-05-01 NOTE — Progress Notes (Signed)
   Subjective:    Patient ID: Alan Rojas, male    DOB: 1966/03/04, 51 y.o.   MRN: [id number]  HPI: He presents today on referral from his wife. He is concerned about discoloration of the toenail medial aspect tibial border hallux left. He is also concerned about dry itchy skin. He states that his right foot is fine.    Review of Systems  All other systems reviewed and are negative.      Objective:   Physical Exam: Vital signs are stable he is alert and oriented 3. Pulses are palpable. Neurologic sensorium is intact per Semmes-Weinstein monofilament. Tendon reflexes are intact bilateral muscle strength +5 over 5 dorsiflexion plantar flexors and inverters everters all intrinsic musculature is intact. Orthopedic evaluation demonstrates all joints distal to the ankle for range of motion without crepitation. Adductor varus rotated hammertoe fourth left. Cutaneous evaluation Mr. is no open lesions or wounds. He does have dry xerotic skin onto the plantar surface of the left foot with mild petechial lesions noted. Thickening and discoloration of the medial distal of the hallux nail left.        Assessment & Plan:  Assessment: Nail dystrophy cannot rule out onychomycosis. Tinea pedis cannot rule out xerosis.  Plan: Samples of the skin and nail were taken today for pathologic evaluation we will notify him as of today those results otherwise we'll follow-up with him in 4 weeks.

## 2017-05-01 NOTE — Patient Instructions (Signed)

## 2017-05-11 ENCOUNTER — Ambulatory Visit (AMBULATORY_SURGERY_CENTER): Payer: Self-pay | Admitting: *Deleted

## 2017-05-11 VITALS — Ht 71.0 in | Wt 195.0 lb

## 2017-05-11 DIAGNOSIS — Z1211 Encounter for screening for malignant neoplasm of colon: Secondary | ICD-10-CM

## 2017-05-11 MED ORDER — NA SULFATE-K SULFATE-MG SULF 17.5-3.13-1.6 GM/177ML PO SOLN: ORAL | 0 refills | Status: DC

## 2017-05-11 NOTE — Progress Notes (Signed)
Patient denies any allergies to eggs or soy. Patient denies any problems with anesthesia/sedation. Patient denies any oxygen use at home and does not take any diet/weight loss medications. EMMI education assisgned to patient on colonoscopy, this was explained and instructions given to patient. 

## 2017-05-22 ENCOUNTER — Ambulatory Visit (INDEPENDENT_AMBULATORY_CARE_PROVIDER_SITE_OTHER): Payer: 59 | Admitting: Family Medicine

## 2017-05-22 DIAGNOSIS — Z23 Encounter for immunization: Secondary | ICD-10-CM

## 2017-05-22 NOTE — Progress Notes (Signed)
Patient presented to office for second Shingrix.  Patient tolerated injection well.

## 2017-05-22 NOTE — Progress Notes (Signed)
Agree/noted. 

## 2017-05-25 ENCOUNTER — Ambulatory Visit (AMBULATORY_SURGERY_CENTER): Payer: 59 | Admitting: Gastroenterology

## 2017-05-25 ENCOUNTER — Encounter: Payer: Self-pay | Admitting: Gastroenterology

## 2017-05-25 VITALS — BP 116/77 | HR 58 | Temp 98.9°F | Resp 17 | Ht 71.0 in | Wt 195.0 lb

## 2017-05-25 DIAGNOSIS — Z1211 Encounter for screening for malignant neoplasm of colon: Secondary | ICD-10-CM

## 2017-05-25 DIAGNOSIS — K5730 Diverticulosis of large intestine without perforation or abscess without bleeding: Secondary | ICD-10-CM

## 2017-05-25 DIAGNOSIS — K635 Polyp of colon: Secondary | ICD-10-CM | POA: Diagnosis not present

## 2017-05-25 DIAGNOSIS — D125 Benign neoplasm of sigmoid colon: Secondary | ICD-10-CM

## 2017-05-25 HISTORY — PX: COLONOSCOPY: SHX174

## 2017-05-25 MED ORDER — SODIUM CHLORIDE 0.9 % IV SOLN: 500.0000 mL | INTRAVENOUS | Status: DC

## 2017-05-25 NOTE — Op Note (Signed)
[facility] Patient Name: Alan Rojas Procedure Date: 05/25/2017 11:19 AM MRN: [id number] Endoscopist: Milus Banister , MD Age: 51 Referring MD:  Date of Birth: Dec 16, 1965 Gender: Male Account #: [id number] Procedure:                Colonoscopy Indications:              Screening for colorectal malignant neoplasm Medicines:                Monitored Anesthesia Care Procedure:                Pre-Anesthesia Assessment:                           - Prior to the procedure, a History and Physical                            was performed, and patient medications and                            allergies were reviewed. The patient's tolerance of                            previous anesthesia was also reviewed. The risks                            and benefits of the procedure and the sedation                            options and risks were discussed with the patient.                            All questions were answered, and informed consent                            was obtained. Prior Anticoagulants: The patient has                            taken no previous anticoagulant or antiplatelet                            agents. ASA Grade Assessment: II - A patient with                            mild systemic disease. After reviewing the risks                            and benefits, the patient was deemed in                            satisfactory condition to undergo the procedure.                           After obtaining informed consent, the colonoscope  was passed under direct vision. Throughout the                            procedure, the patient's blood pressure, pulse, and                            oxygen saturations were monitored continuously. The                            Colonoscope was introduced through the anus and                            advanced to the the cecum, identified by                            appendiceal orifice and  ileocecal valve. The                            colonoscopy was performed without difficulty. The                            patient tolerated the procedure well. The quality                            of the bowel preparation was excellent. The                            ileocecal valve, appendiceal orifice, and rectum                            were photographed. Scope In: 11:26:37 AM Scope Out: 11:37:17 AM Scope Withdrawal Time: 0 hours 8 minutes 1 second  Total Procedure Duration: 0 hours 10 minutes 40 seconds  Findings:                 Two sessile polyps were found in the sigmoid colon.                            The polyps were 2 to 3 mm in size. These polyps                            were removed with a cold snare. Resection and                            retrieval were complete.                           Many small-mouthed diverticula were found in the                            left colon.                           The exam was otherwise without abnormality on  direct and retroflexion views. Complications:            No immediate complications. Estimated blood loss:                            None. Estimated Blood Loss:     Estimated blood loss: none. Impression:               - Two 2 to 3 mm polyps in the sigmoid colon,                            removed with a cold snare. Resected and retrieved.                           - Diverticulosis in the left colon.                           - The examination was otherwise normal on direct                            and retroflexion views. Recommendation:           - Patient has a contact number available for                            emergencies. The signs and symptoms of potential                            delayed complications were discussed with the                            patient. Return to normal activities tomorrow.                            Written discharge instructions were provided to the                             patient.                           - Resume previous diet.                           - Continue present medications.                           You will receive a letter within 2-3 weeks with the                            pathology results and my final recommendations.                           If the polyp(s) is proven to be 'pre-cancerous' on                            pathology, you will need repeat colonoscopy in 5  years. If the polyp(s) is NOT 'precancerous' on                            pathology then you should repeat colon cancer                            screening in 10 years with colonoscopy without need                            for colon cancer screening by any method prior to                            then (including stool testing). Milus Banister, MD 05/25/2017 11:43:03 AM This report has been signed electronically.

## 2017-05-25 NOTE — Patient Instructions (Signed)
YOU HAD AN ENDOSCOPIC PROCEDURE TODAY AT THE [facility]:   Refer to the procedure report that was given to you for any specific questions about what was found during the examination.  If the procedure report does not answer your questions, please call your gastroenterologist to clarify.  If you requested that your care partner not be given the details of your procedure findings, then the procedure report has been included in a sealed envelope for you to review at your convenience later.  YOU SHOULD EXPECT: Some feelings of bloating in the abdomen. Passage of more gas than usual.  Walking can help get rid of the air that was put into your GI tract during the procedure and reduce the bloating. If you had a lower endoscopy (such as a colonoscopy or flexible sigmoidoscopy) you may notice spotting of blood in your stool or on the toilet paper. If you underwent a bowel prep for your procedure, you may not have a normal bowel movement for a few days.  Please Note:  You might notice some irritation and congestion in your nose or some drainage.  This is from the oxygen used during your procedure.  There is no need for concern and it should clear up in a day or so.  SYMPTOMS TO REPORT IMMEDIATELY:   Following lower endoscopy (colonoscopy or flexible sigmoidoscopy):  Excessive amounts of blood in the stool  Significant tenderness or worsening of abdominal pains  Swelling of the abdomen that is new, acute  Fever of 100F or higher  For urgent or emergent issues, a gastroenterologist can be reached at any hour by calling [phone].   DIET:  We do recommend a small meal at first, but then you may proceed to your regular diet.  Drink plenty of fluids but you should avoid alcoholic beverages for 24 hours.  ACTIVITY:  You should plan to take it easy for the rest of today and you should NOT DRIVE or use heavy machinery until tomorrow (because of the sedation medicines used during the test).     FOLLOW UP: Our staff will call the number listed on your records the next business day following your procedure to check on you and address any questions or concerns that you may have regarding the information given to you following your procedure. If we do not reach you, we will leave a message.  However, if you are feeling well and you are not experiencing any problems, there is no need to return our call.  We will assume that you have returned to your regular daily activities without incident.  If any biopsies were taken you will be contacted by phone or by letter within the next 1-3 weeks.  Please call us at [phone] if you have not heard about the biopsies in 3 weeks.   Await for biopsy results to determine next repeat Colonoscopy screening Polyps (handout given) Diverticulosis (handout given)   SIGNATURES/CONFIDENTIALITY: You and/or your care partner have signed paperwork which will be entered into your electronic medical record.  These signatures attest to the fact that that the information above on your After Visit Summary has been reviewed and is understood.  Full responsibility of the confidentiality of this discharge information lies with you and/or your care-partner. 

## 2017-05-25 NOTE — Progress Notes (Signed)
Report to PACU, RN, vss, BBS= Clear.  

## 2017-05-25 NOTE — Progress Notes (Signed)
Called to room to assist during endoscopic procedure.  Patient ID and intended procedure confirmed with present staff. Received instructions for my participation in the procedure from the performing physician.  

## 2017-05-28 ENCOUNTER — Telehealth: Payer: Self-pay | Admitting: *Deleted

## 2017-05-28 NOTE — Telephone Encounter (Signed)
  Follow up Call-  Call back number 05/25/2017  Post procedure Call Back phone  # [phone]  Permission to leave phone message Yes  Some recent data might be hidden     Patient questions:  Do you have a fever, pain , or abdominal swelling? No. Pain Score  0 *  Have you tolerated food without any problems? Yes.    Have you been able to return to your normal activities? Yes.    Do you have any questions about your discharge instructions: Diet   No. Medications  No. Follow up visit  No.  Do you have questions or concerns about your Care? No.  Actions: * If pain score is 4 or above: No action needed, pain <4.

## 2017-05-29 ENCOUNTER — Ambulatory Visit (INDEPENDENT_AMBULATORY_CARE_PROVIDER_SITE_OTHER): Payer: 59 | Admitting: Podiatry

## 2017-05-29 ENCOUNTER — Encounter: Payer: Self-pay | Admitting: Podiatry

## 2017-05-29 DIAGNOSIS — L603 Nail dystrophy: Secondary | ICD-10-CM | POA: Diagnosis not present

## 2017-05-29 MED ORDER — TERBINAFINE HCL 250 MG PO TABS: 250.0000 mg | ORAL_TABLET | Freq: Every day | ORAL | 0 refills | Status: DC

## 2017-05-29 NOTE — Progress Notes (Signed)
Presents today for follow-up of his pathology report and nail fungus.  Objective: Vital signs are stable alert and oriented 3. Pulses are palpable. Pathology report does state onychomycosis with dermatophyte.  Assessment: Onychomycosis with dermatophyte.  Plan: After thorough discussion of options he decided oral therapy since this with his wife is doing. We started him on oral therapy today Lamisil 250 mg tablets 1 by mouth daily and requested a liver profile which was already performed less than 1 month ago after evaluating that today no need for a liver profile before performed at this time. Follow up with him in 1 month.

## 2017-05-31 ENCOUNTER — Ambulatory Visit: Payer: 59 | Admitting: Podiatry

## 2017-05-31 ENCOUNTER — Encounter: Payer: Self-pay | Admitting: Gastroenterology

## 2017-06-04 ENCOUNTER — Encounter: Payer: Self-pay | Admitting: Family Medicine

## 2017-06-26 ENCOUNTER — Ambulatory Visit (INDEPENDENT_AMBULATORY_CARE_PROVIDER_SITE_OTHER): Payer: 59 | Admitting: Podiatry

## 2017-06-26 DIAGNOSIS — B353 Tinea pedis: Secondary | ICD-10-CM | POA: Diagnosis not present

## 2017-06-26 DIAGNOSIS — Z79899 Other long term (current) drug therapy: Secondary | ICD-10-CM | POA: Diagnosis not present

## 2017-06-26 LAB — HEPATIC FUNCTION PANEL
AG Ratio: 2.1 (calc) (ref 1.0–2.5)
ALBUMIN MSPROF: 4.7 g/dL (ref 3.6–5.1)
ALT: 17 U/L (ref 9–46)
AST: 16 U/L (ref 10–35)
Alkaline phosphatase (APISO): 63 U/L (ref 40–115)
BILIRUBIN DIRECT: 0.1 mg/dL (ref 0.0–0.2)
Globulin: 2.2 g/dL (calc) (ref 1.9–3.7)
Indirect Bilirubin: 0.3 mg/dL (calc) (ref 0.2–1.2)
Total Bilirubin: 0.4 mg/dL (ref 0.2–1.2)
Total Protein: 6.9 g/dL (ref 6.1–8.1)

## 2017-06-26 MED ORDER — TERBINAFINE HCL 250 MG PO TABS: 250.0000 mg | ORAL_TABLET | Freq: Every day | ORAL | 0 refills | Status: DC

## 2017-06-26 NOTE — Progress Notes (Signed)
He presents today for follow-up tinea pedis. He denies any changes in his past medications allergies states that he has not had any prostate medication.  Objective: Vital signs stable alert and oriented 3. Pulses are palpable. Skin the plantar surface of the foot appears to be getting a little better.  Assessment: Tinea pedis. Onychomycosis.  Plan: Continue another 90 days of Lamisil therapy we also requested another liver profile. We will notify him with any questions or concerns. Once the results return we will notify him with any changes.

## 2017-06-27 ENCOUNTER — Telehealth: Payer: Self-pay | Admitting: *Deleted

## 2017-06-27 NOTE — Telephone Encounter (Addendum)
-----   Message from Garrel Ridgel, [place] sent at 06/27/2017  7:36 AM EDT ----- Blood work looks perfect and he may continue his medication. I informed pt of Dr. Stephenie Acres review of results and orders.

## 2017-09-27 ENCOUNTER — Ambulatory Visit (INDEPENDENT_AMBULATORY_CARE_PROVIDER_SITE_OTHER): Payer: 59 | Admitting: Podiatry

## 2017-09-27 ENCOUNTER — Encounter: Payer: Self-pay | Admitting: Podiatry

## 2017-09-27 DIAGNOSIS — B353 Tinea pedis: Secondary | ICD-10-CM | POA: Diagnosis not present

## 2017-09-27 DIAGNOSIS — L603 Nail dystrophy: Secondary | ICD-10-CM | POA: Diagnosis not present

## 2017-09-27 MED ORDER — TERBINAFINE HCL 250 MG PO TABS: 250.0000 mg | ORAL_TABLET | Freq: Every day | ORAL | 0 refills | Status: DC

## 2017-09-27 NOTE — Progress Notes (Signed)
He presents today for follow-up of his onychomycosis.  States that he has completed 120 days of Lamisil.  He states that he was thinks he is doing very well though recently he had developed some esophageal reflux during the night and early morning.  He states his been taking the medicine in the morning but does not understand why he is having reflux at night.  States that he could be multiple things because he has stress in his life right now he is cut his acid levels back.  He states that currently he is doing okay.  Objective: Vital signs are stable he is alert and oriented x3 very small corner of the nail remains distal medial aspect of the hallux left nail plate.  Otherwise it appears to be grown out nearly 100%.  Assessment: Onychomycosis retained distal most aspect of the nail medial border hallux left.  Plan: At this point I took a small piece of the nail out to help remove it from the site.  Requested that he continue the medication 1 tablet every other day.

## 2017-10-03 ENCOUNTER — Telehealth: Payer: Self-pay | Admitting: *Deleted

## 2017-10-03 MED ORDER — TERBINAFINE HCL 250 MG PO TABS: 250.0000 mg | ORAL_TABLET | Freq: Every day | ORAL | 0 refills | Status: DC

## 2017-10-03 NOTE — Telephone Encounter (Signed)
Received request for new rx to be sent to [id number]. Dr. Marta Antu.

## 2017-12-27 ENCOUNTER — Ambulatory Visit (INDEPENDENT_AMBULATORY_CARE_PROVIDER_SITE_OTHER): Payer: 59 | Admitting: Podiatry

## 2017-12-27 ENCOUNTER — Encounter: Payer: Self-pay | Admitting: Podiatry

## 2017-12-27 DIAGNOSIS — L603 Nail dystrophy: Secondary | ICD-10-CM | POA: Diagnosis not present

## 2017-12-27 NOTE — Progress Notes (Signed)
He presents today for follow-up of his Lamisil therapy.  He states that is doing very well he is 100% grown out.  Denies any problems taking medication.  Objective: Vital signs are stable alert and oriented x3 nail plates are 683% clear in and the patient is very happy.  There is no pain.  Assessment: Well-healing onychomycosis.  Plan: Follow-up with me on an as-needed basis.

## 2018-03-06 ENCOUNTER — Encounter: Payer: Self-pay | Admitting: Family Medicine

## 2018-03-06 ENCOUNTER — Ambulatory Visit (INDEPENDENT_AMBULATORY_CARE_PROVIDER_SITE_OTHER): Payer: 59 | Admitting: Family Medicine

## 2018-03-06 VITALS — BP 113/72 | HR 63 | Temp 98.3°F | Resp 16 | Ht 71.0 in | Wt 193.4 lb

## 2018-03-06 DIAGNOSIS — E7800 Pure hypercholesterolemia, unspecified: Secondary | ICD-10-CM | POA: Diagnosis not present

## 2018-03-06 DIAGNOSIS — Z125 Encounter for screening for malignant neoplasm of prostate: Secondary | ICD-10-CM | POA: Diagnosis not present

## 2018-03-06 DIAGNOSIS — Z0000 Encounter for general adult medical examination without abnormal findings: Secondary | ICD-10-CM

## 2018-03-06 DIAGNOSIS — E663 Overweight: Secondary | ICD-10-CM | POA: Diagnosis not present

## 2018-03-06 MED ORDER — METRONIDAZOLE 0.75 % EX GEL: 1.0000 "application " | Freq: Two times a day (BID) | CUTANEOUS | 1 refills | Status: DC

## 2018-03-06 NOTE — Progress Notes (Signed)
Office Note 03/06/2018  CC:  Chief Complaint  Patient presents with  . Annual Exam    Pt is not fasting.     HPI:  Alan Rojas is a 52 y.o. White male who is here for annual health maintenance exam.  Exercise: walking 1 mile once a day. Diet: eats healthy. Dental: preventatives UTD. Eyes: due for exam.  Past Medical History:  Diagnosis Date  . Hyperlipidemia    mild, no med indicated 02/2017.  . Lumbar radiculopathy 07/2015  . Tinnitus 04/15/2012    Past Surgical History:  Procedure Laterality Date  . COLONOSCOPY  05/25/2017   Hyperplastic polyp x 2.  Recall 05/2027.  . Lumbar epiduarl steroid injection      Family History  Problem Relation Age of Onset  . Hypertension Mother   . Hypertension Maternal Grandmother   . Prostate cancer Maternal Grandfather   . Colon cancer Neg Hx     Social History   Socioeconomic History  . Marital status: Married    Spouse name: Not on file  . Number of children: Not on file  . Years of education: Not on file  . Highest education level: Not on file  Occupational History  . Not on file  Social Needs  . Financial resource strain: Not on file  . Food insecurity:    Worry: Not on file    Inability: Not on file  . Transportation needs:    Medical: Not on file    Non-medical: Not on file  Tobacco Use  . Smoking status: Never Smoker  . Smokeless tobacco: Never Used  Substance and Sexual Activity  . Alcohol use: No  . Drug use: No  . Sexual activity: Not on file  Lifestyle  . Physical activity:    Days per week: Not on file    Minutes per session: Not on file  . Stress: Not on file  Relationships  . Social connections:    Talks on phone: Not on file    Gets together: Not on file    Attends religious service: Not on file    Active member of club or organization: Not on file    Attends meetings of clubs or organizations: Not on file    Relationship status: Not on file  . Intimate partner violence:    Fear of  current or ex partner: Not on file    Emotionally abused: Not on file    Physically abused: Not on file    Forced sexual activity: Not on file  Other Topics Concern  . Not on file  Social History Narrative   Married, has 4 children (17, 45, 93, 75).   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  Wife has degree in education.   Relocated for [place] approx 2006 to work for [employer].   No T/A/Ds.   Exercises: walks in neighborhood 3-4 times per week.    Outpatient Medications Prior to Visit  Medication Sig Dispense Refill  . Ascorbic Acid (VITAMIN C PO) Take 1 capsule by mouth daily.    . Calcium-Magnesium-Vitamin D (CALCIUM MAGNESIUM PO) Take 1 capsule by mouth daily.    . cholecalciferol (VITAMIN D) 1000 units tablet Take 1,000 Units by mouth daily.    . Flaxseed, Linseed, (FLAX SEEDS PO) Take 1 capsule by mouth daily.     No facility-administered medications prior to visit.     No Known Allergies  ROS Review of Systems  Constitutional: Negative for appetite change, chills, fatigue and fever.  HENT: Negative for congestion, dental problem, ear pain and sore throat.        Ringing in ears--chronic, getting worse. No hearing loss noted.  Eyes: Negative for discharge, redness and visual disturbance.  Respiratory: Negative for cough, chest tightness, shortness of breath and wheezing.   Cardiovascular: Negative for chest pain, palpitations and leg swelling.  Gastrointestinal: Negative for abdominal pain, blood in stool, diarrhea, nausea and vomiting.  Genitourinary: Negative for difficulty urinating, dysuria, flank pain, frequency, hematuria and urgency.  Musculoskeletal: Negative for arthralgias, back pain, joint swelling, myalgias and neck stiffness.  Skin: Negative for pallor and rash.       Has rash on forehead and some on upper cheeks.   Has never been on med for this.  Neurological: Negative for dizziness, speech difficulty, weakness and headaches.  Hematological:  Negative for adenopathy. Does not bruise/bleed easily.  Psychiatric/Behavioral: Negative for confusion and sleep disturbance. The patient is not nervous/anxious.     PE; Blood pressure 113/72, pulse 63, temperature 98.3 F (36.8 C), temperature source Oral, resp. rate 16, height 5\' 11"  (1.803 m), weight 193 lb 6 oz (87.7 kg), SpO2 98 %. Body mass index is 26.97 kg/m.  Gen: Alert, well appearing.  Patient is oriented to person, place, time, and situation. AFFECT: pleasant, lucid thought and speech. ENT: Ears: EACs clear, normal epithelium.  TMs with good light reflex and landmarks bilaterally.  Eyes: no injection, icteris, swelling, or exudate.  EOMI, PERRLA. Nose: no drainage or turbinate edema/swelling.  No injection or focal lesion.  Mouth: lips without lesion/swelling.  Oral mucosa pink and moist.  Dentition intact and without obvious caries or gingival swelling.  Oropharynx without erythema, exudate, or swelling.  Neck: supple/nontender.  No LAD, mass, or TM.  Carotid pulses 2+ bilaterally, without bruits. CV: RRR, no m/r/g.   LUNGS: CTA bilat, nonlabored resps, good aeration in all lung fields. ABD: soft, NT, ND, BS normal.  No hepatospenomegaly or mass.  No bruits. EXT: no clubbing, cyanosis, or edema.  Musculoskeletal: no joint swelling, erythema, warmth, or tenderness.  ROM of all joints intact. Skin - no sores or suspicious lesions or rashes or color changes Rectal exam: negative without mass, lesions or tenderness, PROSTATE EXAM: smooth and symmetric without nodules or tenderness.   Pertinent labs:  Lab Results  Component Value Date   TSH 1.76 03/05/2017   Lab Results  Component Value Date   WBC 3.9 (L) 03/05/2017   HGB 13.6 03/05/2017   HCT 40.2 03/05/2017   MCV 86.3 03/05/2017   PLT 165.0 03/05/2017   Lab Results  Component Value Date   CREATININE 0.96 03/05/2017   BUN 12 03/05/2017   NA 141 03/05/2017   K 4.4 03/05/2017   CL 104 03/05/2017   CO2 32 03/05/2017    Lab Results  Component Value Date   ALT 17 06/26/2017   AST 16 06/26/2017   ALKPHOS 63 03/05/2017   BILITOT 0.4 06/26/2017   Lab Results  Component Value Date   CHOL 221 (H) 03/05/2017   Lab Results  Component Value Date   HDL 45.60 03/05/2017   Lab Results  Component Value Date   LDLCALC 158 (H) 03/05/2017   Lab Results  Component Value Date   TRIG 84.0 03/05/2017   Lab Results  Component Value Date   CHOLHDL 5 03/05/2017   Lab Results  Component Value Date   PSA 0.75 03/05/2017    ASSESSMENT AND PLAN:   Health maintenance exam: Reviewed age and gender  appropriate health maintenance issues (prudent diet, regular exercise, health risks of tobacco and excessive alcohol, use of seatbelts, fire alarms in home, use of sunscreen).  Also reviewed age and gender appropriate health screening as well as vaccine recommendations. Vaccines: UTD, including shingrix x 2. Labs: fasting HP + PSA. Prostate ca screening: DRE normal today, PSA. Colon ca screening: next colonoscopy due 05/2027.  Tinnitus: audiogram today: normal.  Reassured.  Watchful waiting--no referral to ENT at this time. Rosacea--rx'd metronid gel eRx'd today.  An After Visit Summary was printed and given to the patient.  FOLLOW UP:  Return in about 1 year (around 03/07/2019) for --also, needs fasting lab visit at his earliest convenience.  Signed:  Crissie Sickles, MD           03/06/2018

## 2018-03-06 NOTE — Patient Instructions (Addendum)
Ophthalmologist info:  Dr. Elta Guadeloupe Shapiro--[address] [facility] in [place].  Phone [phone]     Health Maintenance, Male A healthy lifestyle and preventive care is important for your health and wellness. Ask your health care provider about what schedule of regular examinations is right for you. What should I know about weight and diet? Eat a Healthy Diet  Eat plenty of vegetables, fruits, whole grains, low-fat dairy products, and lean protein.  Do not eat a lot of foods high in solid fats, added sugars, or salt.  Maintain a Healthy Weight Regular exercise can help you achieve or maintain a healthy weight. You should:  Do at least 150 minutes of exercise each week. The exercise should increase your heart rate and make you sweat (moderate-intensity exercise).  Do strength-training exercises at least twice a week.  Watch Your Levels of Cholesterol and Blood Lipids  Have your blood tested for lipids and cholesterol every 5 years starting at 52 years of age. If you are at high risk for heart disease, you should start having your blood tested when you are 52 years old. You may need to have your cholesterol levels checked more often if: ? Your lipid or cholesterol levels are high. ? You are older than 52 years of age. ? You are at high risk for heart disease.  What should I know about cancer screening? Many types of cancers can be detected early and may often be prevented. Lung Cancer  You should be screened every year for lung cancer if: ? You are a current smoker who has smoked for at least 30 years. ? You are a former smoker who has quit within the past 15 years.  Talk to your health care provider about your screening options, when you should start screening, and how often you should be screened.  Colorectal Cancer  Routine colorectal cancer screening usually begins at 53 years of age and should be repeated every 5-10 years until you are 53 years old. You may need to be screened  more often if early forms of precancerous polyps or small growths are found. Your health care provider may recommend screening at an earlier age if you have risk factors for colon cancer.  Your health care provider may recommend using home test kits to check for hidden blood in the stool.  A small camera at the end of a tube can be used to examine your colon (sigmoidoscopy or colonoscopy). This checks for the earliest forms of colorectal cancer.  Prostate and Testicular Cancer  Depending on your age and overall health, your health care provider may do certain tests to screen for prostate and testicular cancer.  Talk to your health care provider about any symptoms or concerns you have about testicular or prostate cancer.  Skin Cancer  Check your skin from head to toe regularly.  Tell your health care provider about any new moles or changes in moles, especially if: ? There is a change in a mole's size, shape, or color. ? You have a mole that is larger than a pencil eraser.  Always use sunscreen. Apply sunscreen liberally and repeat throughout the day.  Protect yourself by wearing long sleeves, pants, a wide-brimmed hat, and sunglasses when outside.  What should I know about heart disease, diabetes, and high blood pressure?  If you are 71-52 years of age, have your blood pressure checked every 3-5 years. If you are 60 years of age or older, have your blood pressure checked every year. You  should have your blood pressure measured twice-once when you are at a hospital or clinic, and once when you are not at a hospital or clinic. Record the average of the two measurements. To check your blood pressure when you are not at a hospital or clinic, you can use: ? An automated blood pressure machine at a pharmacy. ? A home blood pressure monitor.  Talk to your health care provider about your target blood pressure.  If you are between 84-11 years old, ask your health care provider if you should  take aspirin to prevent heart disease.  Have regular diabetes screenings by checking your fasting blood sugar level. ? If you are at a normal weight and have a low risk for diabetes, have this test once every three years after the age of 51. ? If you are overweight and have a high risk for diabetes, consider being tested at a younger age or more often.  A one-time screening for abdominal aortic aneurysm (AAA) by ultrasound is recommended for men aged 2-75 years who are current or former smokers. What should I know about preventing infection? Hepatitis B If you have a higher risk for hepatitis B, you should be screened for this virus. Talk with your health care provider to find out if you are at risk for hepatitis B infection. Hepatitis C Blood testing is recommended for:  Everyone born from [birth year] through 1965.  Anyone with known risk factors for hepatitis C.  Sexually Transmitted Diseases (STDs)  You should be screened each year for STDs including gonorrhea and chlamydia if: ? You are sexually active and are younger than 52 years of age. ? You are older than 52 years of age and your health care provider tells you that you are at risk for this type of infection. ? Your sexual activity has changed since you were last screened and you are at an increased risk for chlamydia or gonorrhea. Ask your health care provider if you are at risk.  Talk with your health care provider about whether you are at high risk of being infected with HIV. Your health care provider may recommend a prescription medicine to help prevent HIV infection.  What else can I do?  Schedule regular health, dental, and eye exams.  Stay current with your vaccines (immunizations).  Do not use any tobacco products, such as cigarettes, chewing tobacco, and e-cigarettes. If you need help quitting, ask your health care provider.  Limit alcohol intake to no more than 2 drinks per day. One drink equals 12 ounces of beer, 5  ounces of wine, or 1 ounces of hard liquor.  Do not use street drugs.  Do not share needles.  Ask your health care provider for help if you need support or information about quitting drugs.  Tell your health care provider if you often feel depressed.  Tell your health care provider if you have ever been abused or do not feel safe at home. This information is not intended to replace advice given to you by your health care provider. Make sure you discuss any questions you have with your health care provider. Document Released: 03/02/2008 Document Revised: 05/03/2016 Document Reviewed: 06/08/2015 Elsevier Interactive Patient Education  [employer].

## 2018-03-07 ENCOUNTER — Encounter: Payer: Self-pay | Admitting: Family Medicine

## 2018-03-07 ENCOUNTER — Other Ambulatory Visit (INDEPENDENT_AMBULATORY_CARE_PROVIDER_SITE_OTHER): Payer: 59

## 2018-03-07 DIAGNOSIS — E7800 Pure hypercholesterolemia, unspecified: Secondary | ICD-10-CM | POA: Diagnosis not present

## 2018-03-07 DIAGNOSIS — Z125 Encounter for screening for malignant neoplasm of prostate: Secondary | ICD-10-CM

## 2018-03-07 DIAGNOSIS — Z0000 Encounter for general adult medical examination without abnormal findings: Secondary | ICD-10-CM | POA: Diagnosis not present

## 2018-03-07 LAB — CBC WITH DIFFERENTIAL/PLATELET
BASOS PCT: 0.5 % (ref 0.0–3.0)
Basophils Absolute: 0 10*3/uL (ref 0.0–0.1)
EOS ABS: 0 10*3/uL (ref 0.0–0.7)
EOS PCT: 1.3 % (ref 0.0–5.0)
HCT: 40.2 % (ref 39.0–52.0)
Hemoglobin: 13.8 g/dL (ref 13.0–17.0)
LYMPHS ABS: 1 10*3/uL (ref 0.7–4.0)
Lymphocytes Relative: 26.9 % (ref 12.0–46.0)
MCHC: 34.2 g/dL (ref 30.0–36.0)
MCV: 87.2 fl (ref 78.0–100.0)
MONO ABS: 0.4 10*3/uL (ref 0.1–1.0)
Monocytes Relative: 9.5 % (ref 3.0–12.0)
NEUTROS ABS: 2.3 10*3/uL (ref 1.4–7.7)
Neutrophils Relative %: 61.8 % (ref 43.0–77.0)
Platelets: 151 10*3/uL (ref 150.0–400.0)
RBC: 4.61 Mil/uL (ref 4.22–5.81)
RDW: 13.6 % (ref 11.5–15.5)
WBC: 3.7 10*3/uL — ABNORMAL LOW (ref 4.0–10.5)

## 2018-03-07 LAB — LIPID PANEL
CHOL/HDL RATIO: 5
CHOLESTEROL: 214 mg/dL — AB (ref 0–200)
HDL: 43.9 mg/dL (ref >39.00)
LDL Cholesterol: 147 mg/dL — ABNORMAL HIGH (ref 0–99)
NonHDL: 170.15
TRIGLYCERIDES: 116 mg/dL (ref 0.0–149.0)
VLDL: 23.2 mg/dL (ref 0.0–40.0)

## 2018-03-07 LAB — COMPREHENSIVE METABOLIC PANEL
ALBUMIN: 4.6 g/dL (ref 3.5–5.2)
ALT: 19 U/L (ref 0–53)
AST: 16 U/L (ref 0–37)
Alkaline Phosphatase: 68 U/L (ref 39–117)
BILIRUBIN TOTAL: 0.6 mg/dL (ref 0.2–1.2)
BUN: 17 mg/dL (ref 6–23)
CALCIUM: 9.8 mg/dL (ref 8.4–10.5)
CO2: 32 meq/L (ref 19–32)
CREATININE: 1.03 mg/dL (ref 0.40–1.50)
Chloride: 103 mEq/L (ref 96–112)
GFR: 80.68 mL/min (ref >60.00)
Glucose, Bld: 94 mg/dL (ref 70–99)
Potassium: 4.5 mEq/L (ref 3.5–5.1)
Sodium: 142 mEq/L (ref 135–145)
Total Protein: 6.9 g/dL (ref 6.0–8.3)

## 2018-03-07 LAB — PSA: PSA: 0.82 ng/mL (ref 0.10–4.00)

## 2018-03-07 LAB — TSH: TSH: 1.84 u[IU]/mL (ref 0.35–4.50)

## 2018-08-07 ENCOUNTER — Encounter: Payer: Self-pay | Admitting: *Deleted

## 2019-03-12 ENCOUNTER — Ambulatory Visit (INDEPENDENT_AMBULATORY_CARE_PROVIDER_SITE_OTHER): Payer: 59 | Admitting: Family Medicine

## 2019-03-12 ENCOUNTER — Other Ambulatory Visit: Payer: Self-pay

## 2019-03-12 ENCOUNTER — Encounter: Payer: Self-pay | Admitting: Family Medicine

## 2019-03-12 ENCOUNTER — Encounter: Payer: 59 | Admitting: Family Medicine

## 2019-03-12 VITALS — BP 113/79 | HR 66 | Temp 97.9°F | Resp 16 | Ht 71.0 in | Wt 190.0 lb

## 2019-03-12 DIAGNOSIS — Z0000 Encounter for general adult medical examination without abnormal findings: Secondary | ICD-10-CM | POA: Diagnosis not present

## 2019-03-12 DIAGNOSIS — Z125 Encounter for screening for malignant neoplasm of prostate: Secondary | ICD-10-CM | POA: Diagnosis not present

## 2019-03-12 DIAGNOSIS — E7800 Pure hypercholesterolemia, unspecified: Secondary | ICD-10-CM | POA: Diagnosis not present

## 2019-03-12 LAB — COMPREHENSIVE METABOLIC PANEL
ALT: 16 U/L (ref 0–53)
AST: 16 U/L (ref 0–37)
Albumin: 4.7 g/dL (ref 3.5–5.2)
Alkaline Phosphatase: 67 U/L (ref 39–117)
BUN: 15 mg/dL (ref 6–23)
CO2: 31 mEq/L (ref 19–32)
Calcium: 9.4 mg/dL (ref 8.4–10.5)
Chloride: 104 mEq/L (ref 96–112)
Creatinine, Ser: 0.98 mg/dL (ref 0.40–1.50)
GFR: 80.08 mL/min (ref >60.00)
Glucose, Bld: 92 mg/dL (ref 70–99)
Potassium: 4.1 mEq/L (ref 3.5–5.1)
Sodium: 141 mEq/L (ref 135–145)
Total Bilirubin: 0.6 mg/dL (ref 0.2–1.2)
Total Protein: 6.7 g/dL (ref 6.0–8.3)

## 2019-03-12 LAB — CBC WITH DIFFERENTIAL/PLATELET
Basophils Absolute: 0 10*3/uL (ref 0.0–0.1)
Basophils Relative: 0.6 % (ref 0.0–3.0)
Eosinophils Absolute: 0.1 10*3/uL (ref 0.0–0.7)
Eosinophils Relative: 1.4 % (ref 0.0–5.0)
HCT: 40.1 % (ref 39.0–52.0)
Hemoglobin: 13.4 g/dL (ref 13.0–17.0)
Lymphocytes Relative: 26 % (ref 12.0–46.0)
Lymphs Abs: 1 10*3/uL (ref 0.7–4.0)
MCHC: 33.4 g/dL (ref 30.0–36.0)
MCV: 88.9 fl (ref 78.0–100.0)
Monocytes Absolute: 0.4 10*3/uL (ref 0.1–1.0)
Monocytes Relative: 10.1 % (ref 3.0–12.0)
Neutro Abs: 2.3 10*3/uL (ref 1.4–7.7)
Neutrophils Relative %: 61.9 % (ref 43.0–77.0)
Platelets: 147 10*3/uL — ABNORMAL LOW (ref 150.0–400.0)
RBC: 4.51 Mil/uL (ref 4.22–5.81)
RDW: 13.3 % (ref 11.5–15.5)
WBC: 3.7 10*3/uL — ABNORMAL LOW (ref 4.0–10.5)

## 2019-03-12 LAB — LIPID PANEL
Cholesterol: 208 mg/dL — ABNORMAL HIGH (ref 0–200)
HDL: 44.1 mg/dL (ref >39.00)
LDL Cholesterol: 139 mg/dL — ABNORMAL HIGH (ref 0–99)
NonHDL: 163.66
Total CHOL/HDL Ratio: 5
Triglycerides: 121 mg/dL (ref 0.0–149.0)
VLDL: 24.2 mg/dL (ref 0.0–40.0)

## 2019-03-12 LAB — TSH: TSH: 2.34 u[IU]/mL (ref 0.35–4.50)

## 2019-03-12 LAB — PSA: PSA: 0.78 ng/mL (ref 0.10–4.00)

## 2019-03-12 NOTE — Progress Notes (Signed)
Office Note 03/12/2019  CC:  Chief Complaint  Patient presents with  . Annual Exam    pt is fasting    HPI:  Alan Rojas is a 53 y.o. White male who is here for annual health maintenance exam. Feeling well.   Exercises: walking 1-2 miles per day.  All his kids are old enough to be out of his home now!  Past Medical History:  Diagnosis Date  . Hyperlipidemia    mild, no med indicated 02/2017.  Unchanged 02/2018--[facility]  . Lumbar radiculopathy 07/2015  . Tinnitus 04/15/2012    Past Surgical History:  Procedure Laterality Date  . COLONOSCOPY  05/25/2017   Hyperplastic polyp x 2.  Recall 05/2027.  . Lumbar epiduarl steroid injection      Family History  Problem Relation Age of Onset  . Hypertension Mother   . Hypertension Maternal Grandmother   . Prostate cancer Maternal Grandfather   . Colon cancer Neg Hx     Social History   Socioeconomic History  . Marital status: Married    Spouse name: Not on file  . Number of children: Not on file  . Years of education: Not on file  . Highest education level: Not on file  Occupational History  . Not on file  Social Needs  . Financial resource strain: Not on file  . Food insecurity    Worry: Not on file    Inability: Not on file  . Transportation needs    Medical: Not on file    Non-medical: Not on file  Tobacco Use  . Smoking status: Never Smoker  . Smokeless tobacco: Never Used  Substance and Sexual Activity  . Alcohol use: No  . Drug use: No  . Sexual activity: Not on file  Lifestyle  . Physical activity    Days per week: Not on file    Minutes per session: Not on file  . Stress: Not on file  Relationships  . Social Herbalist on phone: Not on file    Gets together: Not on file    Attends religious service: Not on file    Active member of club or organization: Not on file    Attends meetings of clubs or organizations: Not on file    Relationship status: Not on file  . Intimate partner  violence    Fear of current or ex partner: Not on file    Emotionally abused: Not on file    Physically abused: Not on file    Forced sexual activity: Not on file  Other Topics Concern  . Not on file  Social History Narrative   Married, has 4 children (18, 22, 22, 24--as of 02/2019).   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  Wife has degree in education.   Relocated for [employer] approx 2006 to work for [employer].   No T/A/Ds.   Exercises: walks in neighborhood 3-4 times per week.    Outpatient Medications Prior to Visit  Medication Sig Dispense Refill  . Ascorbic Acid (VITAMIN C PO) Take 1 capsule by mouth daily.    . Calcium-Magnesium-Vitamin D (CALCIUM MAGNESIUM PO) Take 1 capsule by mouth daily.    . cholecalciferol (VITAMIN D) 1000 units tablet Take 1,000 Units by mouth daily.    . metroNIDAZOLE (METROGEL) 0.75 % gel Apply 1 application topically 2 (two) times daily. 45 g 1   No facility-administered medications prior to visit.     No Known Allergies  ROS Review  of Systems  Constitutional: Negative for appetite change, chills, fatigue and fever.  HENT: Negative for congestion, dental problem, ear pain and sore throat.   Eyes: Negative for discharge, redness and visual disturbance.  Respiratory: Negative for cough, chest tightness, shortness of breath and wheezing.   Cardiovascular: Negative for chest pain, palpitations and leg swelling.  Gastrointestinal: Negative for abdominal pain, blood in stool, diarrhea, nausea and vomiting.  Genitourinary: Negative for difficulty urinating, dysuria, flank pain, frequency, hematuria and urgency.  Musculoskeletal: Negative for arthralgias, back pain, joint swelling, myalgias and neck stiffness.  Skin: Negative for pallor and rash.  Neurological: Negative for dizziness, speech difficulty, weakness and headaches.  Hematological: Negative for adenopathy. Does not bruise/bleed easily.  Psychiatric/Behavioral: Negative for  confusion and sleep disturbance. The patient is not nervous/anxious.     PE; Blood pressure 113/79, pulse 66, temperature 97.9 F (36.6 C), temperature source Temporal, resp. rate 16, height 5\' 11"  (1.803 m), weight 190 lb (86.2 kg), SpO2 97 %. Body mass index is 26.5 kg/m.  Gen: Alert, well appearing.  Patient is oriented to person, place, time, and situation. AFFECT: pleasant, lucid thought and speech. ENT: Ears: EACs clear, normal epithelium.  TMs with good light reflex and landmarks bilaterally.  Eyes: no injection, icteris, swelling, or exudate.  EOMI, PERRLA. Nose: no drainage or turbinate edema/swelling.  No injection or focal lesion.  Mouth: lips without lesion/swelling.  Oral mucosa pink and moist.  Dentition intact and without obvious caries or gingival swelling.  Oropharynx without erythema, exudate, or swelling.  Neck: supple/nontender.  No LAD, mass, or TM.  Carotid pulses 2+ bilaterally, without bruits. CV: RRR, no m/r/g.   LUNGS: CTA bilat, nonlabored resps, good aeration in all lung fields. ABD: soft, NT, ND, BS normal.  No hepatospenomegaly or mass.  No bruits. EXT: no clubbing, cyanosis, or edema.  Musculoskeletal: no joint swelling, erythema, warmth, or tenderness.  ROM of all joints intact. Skin - no sores or suspicious lesions or rashes or color changes Rectal exam: negative without mass, lesions or tenderness, PROSTATE EXAM: smooth and symmetric without nodules or tenderness.   Pertinent labs:  Lab Results  Component Value Date   TSH 1.84 03/07/2018   Lab Results  Component Value Date   WBC 3.7 (L) 03/07/2018   HGB 13.8 03/07/2018   HCT 40.2 03/07/2018   MCV 87.2 03/07/2018   PLT 151.0 03/07/2018   Lab Results  Component Value Date   CREATININE 1.03 03/07/2018   BUN 17 03/07/2018   NA 142 03/07/2018   K 4.5 03/07/2018   CL 103 03/07/2018   CO2 32 03/07/2018   Lab Results  Component Value Date   ALT 19 03/07/2018   AST 16 03/07/2018   ALKPHOS 68  03/07/2018   BILITOT 0.6 03/07/2018   Lab Results  Component Value Date   CHOL 214 (H) 03/07/2018   Lab Results  Component Value Date   HDL 43.90 03/07/2018   Lab Results  Component Value Date   LDLCALC 147 (H) 03/07/2018   Lab Results  Component Value Date   TRIG 116.0 03/07/2018   Lab Results  Component Value Date   CHOLHDL 5 03/07/2018   Lab Results  Component Value Date   PSA 0.82 03/07/2018   PSA 0.75 03/05/2017    ASSESSMENT AND PLAN:   Health maintenance exam: Reviewed age and gender appropriate health maintenance issues (prudent diet, regular exercise, health risks of tobacco and excessive alcohol, use of seatbelts, fire alarms in home, use  of sunscreen).  Also reviewed age and gender appropriate health screening as well as vaccine recommendations. Vaccines: all UTD including shingrix. Labs: fasting HP + PSA. Prostate ca screening: DRE normal today, PSA. Colon ca screening: next colonoscopy due 2028.  An After Visit Summary was printed and given to the patient.  FOLLOW UP:  Return in about 1 year (around 03/11/2020) for annual CPE (fasting).  Signed:  Crissie Sickles, MD           03/12/2019

## 2019-03-12 NOTE — Patient Instructions (Signed)

## 2020-04-06 ENCOUNTER — Encounter: Payer: Self-pay | Admitting: Family Medicine

## 2020-04-06 ENCOUNTER — Ambulatory Visit (INDEPENDENT_AMBULATORY_CARE_PROVIDER_SITE_OTHER): Payer: 59 | Admitting: Family Medicine

## 2020-04-06 ENCOUNTER — Other Ambulatory Visit: Payer: Self-pay

## 2020-04-06 VITALS — BP 135/87 | HR 50 | Temp 98.3°F | Resp 16 | Ht 71.0 in | Wt 181.6 lb

## 2020-04-06 DIAGNOSIS — E785 Hyperlipidemia, unspecified: Secondary | ICD-10-CM | POA: Diagnosis not present

## 2020-04-06 DIAGNOSIS — Z125 Encounter for screening for malignant neoplasm of prostate: Secondary | ICD-10-CM

## 2020-04-06 DIAGNOSIS — Z0000 Encounter for general adult medical examination without abnormal findings: Secondary | ICD-10-CM | POA: Diagnosis not present

## 2020-04-06 LAB — COMPREHENSIVE METABOLIC PANEL
ALT: 18 U/L (ref 0–53)
AST: 17 U/L (ref 0–37)
Albumin: 4.7 g/dL (ref 3.5–5.2)
Alkaline Phosphatase: 66 U/L (ref 39–117)
BUN: 21 mg/dL (ref 6–23)
CO2: 31 mEq/L (ref 19–32)
Calcium: 9.9 mg/dL (ref 8.4–10.5)
Chloride: 103 mEq/L (ref 96–112)
Creatinine, Ser: 1.03 mg/dL (ref 0.40–1.50)
GFR: 75.3 mL/min (ref >60.00)
Glucose, Bld: 89 mg/dL (ref 70–99)
Potassium: 4.5 mEq/L (ref 3.5–5.1)
Sodium: 141 mEq/L (ref 135–145)
Total Bilirubin: 0.6 mg/dL (ref 0.2–1.2)
Total Protein: 6.9 g/dL (ref 6.0–8.3)

## 2020-04-06 LAB — TSH: TSH: 2.42 u[IU]/mL (ref 0.35–4.50)

## 2020-04-06 LAB — CBC WITH DIFFERENTIAL/PLATELET
Basophils Absolute: 0 10*3/uL (ref 0.0–0.1)
Basophils Relative: 0.4 % (ref 0.0–3.0)
Eosinophils Absolute: 0.1 10*3/uL (ref 0.0–0.7)
Eosinophils Relative: 1.6 % (ref 0.0–5.0)
HCT: 39.5 % (ref 39.0–52.0)
Hemoglobin: 13.3 g/dL (ref 13.0–17.0)
Lymphocytes Relative: 29.5 % (ref 12.0–46.0)
Lymphs Abs: 1.1 10*3/uL (ref 0.7–4.0)
MCHC: 33.6 g/dL (ref 30.0–36.0)
MCV: 88.8 fl (ref 78.0–100.0)
Monocytes Absolute: 0.4 10*3/uL (ref 0.1–1.0)
Monocytes Relative: 10.9 % (ref 3.0–12.0)
Neutro Abs: 2.1 10*3/uL (ref 1.4–7.7)
Neutrophils Relative %: 57.6 % (ref 43.0–77.0)
Platelets: 145 10*3/uL — ABNORMAL LOW (ref 150.0–400.0)
RBC: 4.45 Mil/uL (ref 4.22–5.81)
RDW: 13.8 % (ref 11.5–15.5)
WBC: 3.7 10*3/uL — ABNORMAL LOW (ref 4.0–10.5)

## 2020-04-06 LAB — LIPID PANEL
Cholesterol: 196 mg/dL (ref 0–200)
HDL: 50 mg/dL (ref >39.00)
LDL Cholesterol: 130 mg/dL — ABNORMAL HIGH (ref 0–99)
NonHDL: 146.34
Total CHOL/HDL Ratio: 4
Triglycerides: 83 mg/dL (ref 0.0–149.0)
VLDL: 16.6 mg/dL (ref 0.0–40.0)

## 2020-04-06 LAB — PSA: PSA: 0.74 ng/mL (ref 0.10–4.00)

## 2020-04-06 MED ORDER — METRONIDAZOLE 0.75 % EX GEL: 1.0000 "application " | Freq: Two times a day (BID) | CUTANEOUS | 5 refills | Status: AC

## 2020-04-06 NOTE — Patient Instructions (Signed)

## 2020-04-06 NOTE — Progress Notes (Signed)
Office Note 04/06/2020  CC:  Chief Complaint  Patient presents with  . Annual Exam    pt is fasting    HPI:  Alan Rojas is a 54 y.o. White male who is here for annual health maintenance exam.  About 1 mo hx of L sided ant neck fullness, occ dysphagia. What he feels goes up and down subtly. He does sing a lot, notes a difference in voice since covid (he suspects he had the illness last year).  Range has dropped a bit.  Some raspiness. No neck pain.  Past Medical History:  Diagnosis Date  . Hyperlipidemia    mild, no med indicated 02/2017.  Unchanged 02/2018 and 02/2019--[facility]  . Lumbar radiculopathy 07/2015  . Tinnitus 04/15/2012    Past Surgical History:  Procedure Laterality Date  . COLONOSCOPY  05/25/2017   Hyperplastic polyp x 2.  Recall 05/2027.  . Lumbar epiduarl steroid injection      Family History  Problem Relation Age of Onset  . Hypertension Mother   . Hypertension Maternal Grandmother   . Prostate cancer Maternal Grandfather   . Colon cancer Neg Hx     Social History   Socioeconomic History  . Marital status: Married    Spouse name: Not on file  . Number of children: Not on file  . Years of education: Not on file  . Highest education level: Not on file  Occupational History  . Not on file  Tobacco Use  . Smoking status: Never Smoker  . Smokeless tobacco: Never Used  Vaping Use  . Vaping Use: Never used  Substance and Sexual Activity  . Alcohol use: No  . Drug use: No  . Sexual activity: Not on file  Other Topics Concern  . Not on file  Social History Narrative   Married, has 4 children (18, 22, 22, 24--as of 02/2019).   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  Wife has degree in education.   Relocated for [employer] approx 2006 to work for [employer].   No T/A/Ds.   Exercises: walks in neighborhood 3-4 times per week.   Social Determinants of Health   Financial Resource Strain:   . Difficulty of Paying Living Expenses:   Food  Insecurity:   . Worried About Charity fundraiser in the Last Year:   . Arboriculturist in the Last Year:   Transportation Needs:   . Film/video editor (Medical):   Marland Kitchen Lack of Transportation (Non-Medical):   Physical Activity:   . Days of Exercise per Week:   . Minutes of Exercise per Session:   Stress:   . Feeling of Stress :   Social Connections:   . Frequency of Communication with Friends and Family:   . Frequency of Social Gatherings with Friends and Family:   . Attends Religious Services:   . Active Member of Clubs or Organizations:   . Attends Archivist Meetings:   Marland Kitchen Marital Status:   Intimate Partner Violence:   . Fear of Current or Ex-Partner:   . Emotionally Abused:   Marland Kitchen Physically Abused:   . Sexually Abused:     Outpatient Medications Prior to Visit  Medication Sig Dispense Refill  . Ascorbic Acid (VITAMIN C PO) Take 1 capsule by mouth daily.    . Calcium-Magnesium-Vitamin D (CALCIUM MAGNESIUM PO) Take 1 capsule by mouth daily.    . cholecalciferol (VITAMIN D) 1000 units tablet Take 1,000 Units by mouth daily.    Marland Kitchen  metroNIDAZOLE (METROGEL) 0.75 % gel Apply 1 application topically 2 (two) times daily. 45 g 1   No facility-administered medications prior to visit.    No Known Allergies  ROS Review of Systems  Constitutional: Negative for appetite change, chills, fatigue and fever.  HENT: Negative for congestion, dental problem, ear pain and sore throat.   Eyes: Negative for discharge, redness and visual disturbance.  Respiratory: Negative for cough, chest tightness, shortness of breath and wheezing.   Cardiovascular: Negative for chest pain, palpitations and leg swelling.  Gastrointestinal: Negative for abdominal pain, blood in stool, diarrhea, nausea and vomiting.  Genitourinary: Negative for difficulty urinating, dysuria, flank pain, frequency, hematuria and urgency.  Musculoskeletal: Negative for arthralgias, back pain, joint swelling, myalgias  and neck stiffness.  Skin: Negative for pallor and rash.  Neurological: Negative for dizziness, speech difficulty, weakness and headaches.  Hematological: Negative for adenopathy. Does not bruise/bleed easily.  Psychiatric/Behavioral: Negative for confusion and sleep disturbance. The patient is not nervous/anxious.     PE; Vitals with BMI 04/06/2020 03/12/2019 03/06/2018  Height 5\' 11"  5\' 11"  5\' 11"   Weight 181 lbs 10 oz 190 lbs 193 lbs 6 oz  BMI 25.34 66.44 03.47  Systolic 425 956 387  Diastolic 87 79 72  Pulse 50 66 63  O2 sat on RA today is 99%  Gen: Alert, well appearing.  Patient is oriented to person, place, time, and situation. AFFECT: pleasant, lucid thought and speech. ENT: Ears: EACs clear, normal epithelium.  TMs with good light reflex and landmarks bilaterally.  Eyes: no injection, icteris, swelling, or exudate.  EOMI, PERRLA. Nose: no drainage or turbinate edema/swelling.  No injection or focal lesion.  Mouth: lips without lesion/swelling.  Oral mucosa pink and moist.  Dentition intact and without obvious caries or gingival swelling.  Oropharynx without erythema, exudate, or swelling.  Neck: supple/nontender.  No LAD, mass, or TM.  Carotid pulses 2+ bilaterally, without bruits. CV: RRR, no m/r/g.   LUNGS: CTA bilat, nonlabored resps, good aeration in all lung fields. ABD: soft, NT, ND, BS normal.  No hepatospenomegaly or mass.  No bruits. EXT: no clubbing, cyanosis, or edema.  Musculoskeletal: no joint swelling, erythema, warmth, or tenderness.  ROM of all joints intact. Skin - no sores or suspicious lesions or rashes or color changes Rectal exam: negative without mass, lesions or tenderness, PROSTATE EXAM: smooth and symmetric without nodules or tenderness.   Pertinent labs:  Lab Results  Component Value Date   TSH 2.34 03/12/2019   Lab Results  Component Value Date   WBC 3.7 (L) 03/12/2019   HGB 13.4 03/12/2019   HCT 40.1 03/12/2019   MCV 88.9 03/12/2019   PLT  147.0 (L) 03/12/2019   Lab Results  Component Value Date   CREATININE 0.98 03/12/2019   BUN 15 03/12/2019   NA 141 03/12/2019   K 4.1 03/12/2019   CL 104 03/12/2019   CO2 31 03/12/2019   Lab Results  Component Value Date   ALT 16 03/12/2019   AST 16 03/12/2019   ALKPHOS 67 03/12/2019   BILITOT 0.6 03/12/2019   Lab Results  Component Value Date   CHOL 208 (H) 03/12/2019   Lab Results  Component Value Date   HDL 44.10 03/12/2019   Lab Results  Component Value Date   LDLCALC 139 (H) 03/12/2019   Lab Results  Component Value Date   TRIG 121.0 03/12/2019   Lab Results  Component Value Date   CHOLHDL 5 03/12/2019   Lab  Results  Component Value Date   PSA 0.78 03/12/2019   PSA 0.82 03/07/2018   PSA 0.75 03/05/2017   ASSESSMENT AND PLAN:   1) Left neck fullness sensation: all normal on exam. Only occasional.  No red flags. Watchful waiting approach.  2) Health maintenance exam: Reviewed age and gender appropriate health maintenance issues (prudent diet, regular exercise, health risks of tobacco and excessive alcohol, use of seatbelts, fire alarms in home, use of sunscreen).  Also reviewed age and gender appropriate health screening as well as vaccine recommendations. Vaccines: Tdap UTD and shingrix UTD.  Covid 19->hasn't had it but is going to in 2-3 wks. Labs: fasting HP ordered. Prostate ca screening: DRE normal today, PSA. Colon ca screening: next colonoscopy due 2028.  An After Visit Summary was printed and given to the patient.  FOLLOW UP:  Return in about 1 year (around 04/06/2021) for annual CPE (fasting).  Signed:  Crissie Sickles, MD           04/06/2020

## 2021-04-05 ENCOUNTER — Other Ambulatory Visit: Payer: Self-pay

## 2021-04-06 ENCOUNTER — Ambulatory Visit (INDEPENDENT_AMBULATORY_CARE_PROVIDER_SITE_OTHER): Payer: 59 | Admitting: Family Medicine

## 2021-04-06 ENCOUNTER — Encounter: Payer: Self-pay | Admitting: Family Medicine

## 2021-04-06 VITALS — BP 122/76 | HR 55 | Temp 97.3°F | Ht 71.0 in | Wt 176.4 lb

## 2021-04-06 DIAGNOSIS — Z125 Encounter for screening for malignant neoplasm of prostate: Secondary | ICD-10-CM | POA: Diagnosis not present

## 2021-04-06 DIAGNOSIS — R030 Elevated blood-pressure reading, without diagnosis of hypertension: Secondary | ICD-10-CM | POA: Diagnosis not present

## 2021-04-06 DIAGNOSIS — Z0000 Encounter for general adult medical examination without abnormal findings: Secondary | ICD-10-CM

## 2021-04-06 LAB — COMPREHENSIVE METABOLIC PANEL
ALT: 20 U/L (ref 0–53)
AST: 19 U/L (ref 0–37)
Albumin: 4.7 g/dL (ref 3.5–5.2)
Alkaline Phosphatase: 58 U/L (ref 39–117)
BUN: 20 mg/dL (ref 6–23)
CO2: 30 mEq/L (ref 19–32)
Calcium: 10 mg/dL (ref 8.4–10.5)
Chloride: 102 mEq/L (ref 96–112)
Creatinine, Ser: 1.05 mg/dL (ref 0.40–1.50)
GFR: 80.29 mL/min (ref >60.00)
Glucose, Bld: 86 mg/dL (ref 70–99)
Potassium: 4.2 mEq/L (ref 3.5–5.1)
Sodium: 141 mEq/L (ref 135–145)
Total Bilirubin: 0.7 mg/dL (ref 0.2–1.2)
Total Protein: 6.8 g/dL (ref 6.0–8.3)

## 2021-04-06 LAB — LIPID PANEL
Cholesterol: 233 mg/dL — ABNORMAL HIGH (ref 0–200)
HDL: 62.3 mg/dL (ref >39.00)
LDL Cholesterol: 154 mg/dL — ABNORMAL HIGH (ref 0–99)
NonHDL: 170.51
Total CHOL/HDL Ratio: 4
Triglycerides: 82 mg/dL (ref 0.0–149.0)
VLDL: 16.4 mg/dL (ref 0.0–40.0)

## 2021-04-06 LAB — CBC WITH DIFFERENTIAL/PLATELET
Basophils Absolute: 0 10*3/uL (ref 0.0–0.1)
Basophils Relative: 0.5 % (ref 0.0–3.0)
Eosinophils Absolute: 0.1 10*3/uL (ref 0.0–0.7)
Eosinophils Relative: 1.9 % (ref 0.0–5.0)
HCT: 39.8 % (ref 39.0–52.0)
Hemoglobin: 13.6 g/dL (ref 13.0–17.0)
Lymphocytes Relative: 32.4 % (ref 12.0–46.0)
Lymphs Abs: 1.1 10*3/uL (ref 0.7–4.0)
MCHC: 34.2 g/dL (ref 30.0–36.0)
MCV: 88 fl (ref 78.0–100.0)
Monocytes Absolute: 0.4 10*3/uL (ref 0.1–1.0)
Monocytes Relative: 11.9 % (ref 3.0–12.0)
Neutro Abs: 1.7 10*3/uL (ref 1.4–7.7)
Neutrophils Relative %: 53.3 % (ref 43.0–77.0)
Platelets: 138 10*3/uL — ABNORMAL LOW (ref 150.0–400.0)
RBC: 4.52 Mil/uL (ref 4.22–5.81)
RDW: 13.6 % (ref 11.5–15.5)
WBC: 3.3 10*3/uL — ABNORMAL LOW (ref 4.0–10.5)

## 2021-04-06 LAB — PSA: PSA: 0.84 ng/mL (ref 0.10–4.00)

## 2021-04-06 LAB — TSH: TSH: 2.44 u[IU]/mL (ref 0.35–5.50)

## 2021-04-06 NOTE — Patient Instructions (Signed)
Health Maintenance, Male Adopting a healthy lifestyle and getting preventive care are important in promoting health and wellness. Ask your health care provider about: The right schedule for you to have regular tests and exams. Things you can do on your own to prevent diseases and keep yourself healthy. What should I know about diet, weight, and exercise? Eat a healthy diet  Eat a diet that includes plenty of vegetables, fruits, low-fat dairy products, and lean protein. Do not eat a lot of foods that are high in solid fats, added sugars, or sodium.  Maintain a healthy weight Body mass index (BMI) is a measurement that can be used to identify possible weight problems. It estimates body fat based on height and weight. Your health care provider can help determine your BMI and help you achieve or maintain ahealthy weight. Get regular exercise Get regular exercise. This is one of the most important things you can do for your health. Most adults should: Exercise for at least 150 minutes each week. The exercise should increase your heart rate and make you sweat (moderate-intensity exercise). Do strengthening exercises at least twice a week. This is in addition to the moderate-intensity exercise. Spend less time sitting. Even light physical activity can be beneficial. Watch cholesterol and blood lipids Have your blood tested for lipids and cholesterol at 55 years of age, then havethis test every 5 years. You may need to have your cholesterol levels checked more often if: Your lipid or cholesterol levels are high. You are older than 55 years of age. You are at high risk for heart disease. What should I know about cancer screening? Many types of cancers can be detected early and may often be prevented. Depending on your health history and family history, you may need to have cancer screening at various ages. This may include screening for: Colorectal cancer. Prostate cancer. Skin cancer. Lung  cancer. What should I know about heart disease, diabetes, and high blood pressure? Blood pressure and heart disease High blood pressure causes heart disease and increases the risk of stroke. This is more likely to develop in people who have high blood pressure readings, are of African descent, or are overweight. Talk with your health care provider about your target blood pressure readings. Have your blood pressure checked: Every 3-5 years if you are 18-39 years of age. Every year if you are 40 years old or older. If you are between the ages of 65 and 75 and are a current or former smoker, ask your health care provider if you should have a one-time screening for abdominal aortic aneurysm (AAA). Diabetes Have regular diabetes screenings. This checks your fasting blood sugar level. Have the screening done: Once every three years after age 45 if you are at a normal weight and have a low risk for diabetes. More often and at a younger age if you are overweight or have a high risk for diabetes. What should I know about preventing infection? Hepatitis B If you have a higher risk for hepatitis B, you should be screened for this virus. Talk with your health care provider to find out if you are at risk forhepatitis B infection. Hepatitis C Blood testing is recommended for: Everyone born from 1945 through 1965. Anyone with known risk factors for hepatitis C. Sexually transmitted infections (STIs) You should be screened each year for STIs, including gonorrhea and chlamydia, if: You are sexually active and are younger than 55 years of age. You are older than 55 years of age   and your health care provider tells you that you are at risk for this type of infection. Your sexual activity has changed since you were last screened, and you are at increased risk for chlamydia or gonorrhea. Ask your health care provider if you are at risk. Ask your health care provider about whether you are at high risk for HIV.  Your health care provider may recommend a prescription medicine to help prevent HIV infection. If you choose to take medicine to prevent HIV, you should first get tested for HIV. You should then be tested every 3 months for as long as you are taking the medicine. Follow these instructions at home: Lifestyle Do not use any products that contain nicotine or tobacco, such as cigarettes, e-cigarettes, and chewing tobacco. If you need help quitting, ask your health care provider. Do not use street drugs. Do not share needles. Ask your health care provider for help if you need support or information about quitting drugs. Alcohol use Do not drink alcohol if your health care provider tells you not to drink. If you drink alcohol: Limit how much you have to 0-2 drinks a day. Be aware of how much alcohol is in your drink. In the [place] one drink equals one 12 oz bottle of beer (355 mL), one 5 oz glass of wine (148 mL), or one 1 oz glass of hard liquor (44 mL). General instructions Schedule regular health, dental, and eye exams. Stay current with your vaccines. Tell your health care provider if: You often feel depressed. You have ever been abused or do not feel safe at home. Summary Adopting a healthy lifestyle and getting preventive care are important in promoting health and wellness. Follow your health care provider's instructions about healthy diet, exercising, and getting tested or screened for diseases. Follow your health care provider's instructions on monitoring your cholesterol and blood pressure. This information is not intended to replace advice given to you by your health care provider. Make sure you discuss any questions you have with your healthcare provider. Document Revised: 08/28/2018 Document Reviewed: 08/28/2018 Elsevier Patient Education  2022 [employer]  

## 2021-04-06 NOTE — Progress Notes (Signed)
Office Note 04/06/2021  CC:  Chief Complaint  Patient presents with   Annual Exam    CPE/labs.  Fasting today.  No concerns.     HPI:  Alan Rojas is a 55 y.o. White male who is here for annual health maintenance exam. A/P as of last visit 1 yr ago: "1) Left neck fullness sensation: all normal on exam. Only occasional.  No red flags. Watchful waiting approach.   2) Health maintenance exam: Reviewed age and gender appropriate health maintenance issues (prudent diet, regular exercise, health risks of tobacco and excessive alcohol, use of seatbelts, fire alarms in home, use of sunscreen).  Also reviewed age and gender appropriate health screening as well as vaccine recommendations. Vaccines: Tdap UTD and shingrix UTD.  Covid 19->hasn't had it but is going to in 2-3 wks. Labs: fasting HP ordered. Prostate ca screening: DRE normal today, PSA. Colon ca screening: next colonoscopy due 2028"  INTERIM HX: All labs were normal last CPE. Doing well, working from home. 30 min treadmill daily. Eating healthy.  Wt down to 170 by his scale and he's happy.   Home bp checks consistently <130/80.  Recent bp check at his dentist 115/70s.   Past Medical History:  Diagnosis Date   Hyperlipidemia    mild, no med indicated 02/2017.  Unchanged 02/2018 and 02/2019--[facility]   Lumbar radiculopathy 07/2015   Tinnitus 04/15/2012    Past Surgical History:  Procedure Laterality Date   COLONOSCOPY  05/25/2017   Hyperplastic polyp x 2.  Recall 05/2027.   Lumbar epiduarl steroid injection      Family History  Problem Relation Age of Onset   Hypertension Mother    Hypertension Maternal Grandmother    Prostate cancer Maternal Grandfather    Colon cancer Neg Hx     Social History   Socioeconomic History   Marital status: Married    Spouse name: Not on file   Number of children: Not on file   Years of education: Not on file   Highest education level: Not on file  Occupational History    Not on file  Tobacco Use   Smoking status: Never   Smokeless tobacco: Never  Vaping Use   Vaping Use: Never used  Substance and Sexual Activity   Alcohol use: No   Drug use: No   Sexual activity: Yes  Other Topics Concern   Not on file  Social History Narrative   Married, has 4 children (84, 9, 84, 24--as of 02/2019).   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  Wife has degree in education.   Relocated for [employer] approx 2006 to work for [employer].   No T/A/Ds.   Exercises: walks in neighborhood 3-4 times per week.   Social Determinants of Health   Financial Resource Strain: Not on file  Food Insecurity: Not on file  Transportation Needs: Not on file  Physical Activity: Not on file  Stress: Not on file  Social Connections: Not on file  Intimate Partner Violence: Not on file    Outpatient Medications Prior to Visit  Medication Sig Dispense Refill   Ascorbic Acid (VITAMIN C PO) Take 1 capsule by mouth daily.     Calcium-Magnesium-Vitamin D (CALCIUM MAGNESIUM PO) Take 1 capsule by mouth daily.     cholecalciferol (VITAMIN D) 1000 units tablet Take 1,000 Units by mouth daily.     metroNIDAZOLE (METROGEL) 0.75 % gel Apply 1 application topically 2 (two) times daily. 45 g 5   No facility-administered medications  prior to visit.    No Known Allergies  ROS Review of Systems  Constitutional:  Negative for appetite change, chills, fatigue and fever.  HENT:  Negative for congestion, dental problem, ear pain and sore throat.   Eyes:  Negative for discharge, redness and visual disturbance.  Respiratory:  Negative for cough, chest tightness, shortness of breath and wheezing.   Cardiovascular:  Negative for chest pain, palpitations and leg swelling.  Gastrointestinal:  Negative for abdominal pain, blood in stool, diarrhea, nausea and vomiting.  Genitourinary:  Negative for difficulty urinating, dysuria, flank pain, frequency, hematuria and urgency.  Musculoskeletal:   Negative for arthralgias, back pain, joint swelling, myalgias and neck stiffness.  Skin:  Negative for pallor and rash.  Neurological:  Negative for dizziness, speech difficulty, weakness and headaches.  Hematological:  Negative for adenopathy. Does not bruise/bleed easily.  Psychiatric/Behavioral:  Negative for confusion and sleep disturbance. The patient is not nervous/anxious.    PE; Vitals with BMI 04/06/2021 04/06/2020 03/12/2019  Height 5\' 11"  5\' 11"  5\' 11"   Weight 176 lbs 6 oz 181 lbs 10 oz 190 lbs  BMI 24.61 38.25 05.39  Systolic 767 341 937  Diastolic 83 87 79  Pulse 55 50 66  Rpt bp with manual cuff at end of visit: 122/76  Gen: Alert, well appearing.  Patient is oriented to person, place, time, and situation. AFFECT: pleasant, lucid thought and speech. ENT: Ears: EACs clear, normal epithelium.  TMs with good light reflex and landmarks bilaterally.  Eyes: no injection, icteris, swelling, or exudate.  EOMI, PERRLA. Nose: no drainage or turbinate edema/swelling.  No injection or focal lesion.  Mouth: lips without lesion/swelling.  Oral mucosa pink and moist.  Dentition intact and without obvious caries or gingival swelling.  Oropharynx without erythema, exudate, or swelling.  Neck: supple/nontender.  No LAD, mass, or TM.  Carotid pulses 2+ bilaterally, without bruits. CV: RRR, no m/r/g.   LUNGS: CTA bilat, nonlabored resps, good aeration in all lung fields. ABD: soft, NT, ND, BS normal.  No hepatospenomegaly or mass.  No bruits. EXT: no clubbing, cyanosis, or edema.  Musculoskeletal: no joint swelling, erythema, warmth, or tenderness.  ROM of all joints intact. Skin - no sores or suspicious lesions or rashes or color changes  Pertinent labs:  Lab Results  Component Value Date   TSH 2.42 04/06/2020   Lab Results  Component Value Date   WBC 3.7 (L) 04/06/2020   HGB 13.3 04/06/2020   HCT 39.5 04/06/2020   MCV 88.8 04/06/2020   PLT 145.0 (L) 04/06/2020   Lab Results   Component Value Date   CREATININE 1.03 04/06/2020   BUN 21 04/06/2020   NA 141 04/06/2020   K 4.5 04/06/2020   CL 103 04/06/2020   CO2 31 04/06/2020   Lab Results  Component Value Date   ALT 18 04/06/2020   AST 17 04/06/2020   ALKPHOS 66 04/06/2020   BILITOT 0.6 04/06/2020   Lab Results  Component Value Date   CHOL 196 04/06/2020   Lab Results  Component Value Date   HDL 50.00 04/06/2020   Lab Results  Component Value Date   LDLCALC 130 (H) 04/06/2020   Lab Results  Component Value Date   TRIG 83.0 04/06/2020   Lab Results  Component Value Date   CHOLHDL 4 04/06/2020   Lab Results  Component Value Date   PSA 0.74 04/06/2020   PSA 0.78 03/12/2019   PSA 0.82 03/07/2018   ASSESSMENT AND PLAN:  Health maintenance exam: Reviewed age and gender appropriate health maintenance issues (prudent diet, regular exercise, health risks of tobacco and excessive alcohol, use of seatbelts, fire alarms in home, use of sunscreen).  Also reviewed age and gender appropriate health screening as well as vaccine recommendations. Vaccines: ALL UTD. Labs: fasting HP labs + PSA. Prostate ca screening: PSA today. Colon ca screening: recall 2028.  Elevated bp w/out dx of HTN: rpt with manual cuff normal here today. Occ bp monitoring outside of our office all normal. Reassured.  An After Visit Summary was printed and given to the patient.  FOLLOW UP:  Return in about 1 year (around 04/06/2022).  Signed:  Crissie Sickles, MD           04/06/2021

## 2021-05-19 HISTORY — PX: RETINAL TEAR REPAIR CRYOTHERAPY: SHX5304

## 2022-07-03 ENCOUNTER — Encounter: Payer: Self-pay | Admitting: Family Medicine

## 2022-07-03 ENCOUNTER — Ambulatory Visit (INDEPENDENT_AMBULATORY_CARE_PROVIDER_SITE_OTHER): Payer: 59 | Admitting: Family Medicine

## 2022-07-03 VITALS — BP 136/74 | HR 58 | Temp 97.6°F | Ht 71.0 in | Wt 171.6 lb

## 2022-07-03 DIAGNOSIS — Z0000 Encounter for general adult medical examination without abnormal findings: Secondary | ICD-10-CM | POA: Diagnosis not present

## 2022-07-03 DIAGNOSIS — Z125 Encounter for screening for malignant neoplasm of prostate: Secondary | ICD-10-CM

## 2022-07-03 LAB — CBC WITH DIFFERENTIAL/PLATELET
Basophils Absolute: 0 10*3/uL (ref 0.0–0.1)
Basophils Relative: 0.6 % (ref 0.0–3.0)
Eosinophils Absolute: 0.1 10*3/uL (ref 0.0–0.7)
Eosinophils Relative: 2.1 % (ref 0.0–5.0)
HCT: 39.4 % (ref 39.0–52.0)
Hemoglobin: 13.4 g/dL (ref 13.0–17.0)
Lymphocytes Relative: 33 % (ref 12.0–46.0)
Lymphs Abs: 1 10*3/uL (ref 0.7–4.0)
MCHC: 34 g/dL (ref 30.0–36.0)
MCV: 87.2 fl (ref 78.0–100.0)
Monocytes Absolute: 0.3 10*3/uL (ref 0.1–1.0)
Monocytes Relative: 11 % (ref 3.0–12.0)
Neutro Abs: 1.7 10*3/uL (ref 1.4–7.7)
Neutrophils Relative %: 53.3 % (ref 43.0–77.0)
Platelets: 140 10*3/uL — ABNORMAL LOW (ref 150.0–400.0)
RBC: 4.51 Mil/uL (ref 4.22–5.81)
RDW: 13.5 % (ref 11.5–15.5)
WBC: 3.1 10*3/uL — ABNORMAL LOW (ref 4.0–10.5)

## 2022-07-03 LAB — COMPREHENSIVE METABOLIC PANEL
ALT: 24 U/L (ref 0–53)
AST: 22 U/L (ref 0–37)
Albumin: 4.6 g/dL (ref 3.5–5.2)
Alkaline Phosphatase: 54 U/L (ref 39–117)
BUN: 19 mg/dL (ref 6–23)
CO2: 31 mEq/L (ref 19–32)
Calcium: 9.8 mg/dL (ref 8.4–10.5)
Chloride: 103 mEq/L (ref 96–112)
Creatinine, Ser: 0.96 mg/dL (ref 0.40–1.50)
GFR: 88.63 mL/min (ref >60.00)
Glucose, Bld: 92 mg/dL (ref 70–99)
Potassium: 4.2 mEq/L (ref 3.5–5.1)
Sodium: 142 mEq/L (ref 135–145)
Total Bilirubin: 0.6 mg/dL (ref 0.2–1.2)
Total Protein: 6.7 g/dL (ref 6.0–8.3)

## 2022-07-03 LAB — PSA: PSA: 0.75 ng/mL (ref 0.10–4.00)

## 2022-07-03 LAB — LIPID PANEL
Cholesterol: 238 mg/dL — ABNORMAL HIGH (ref 0–200)
HDL: 64.9 mg/dL (ref >39.00)
LDL Cholesterol: 156 mg/dL — ABNORMAL HIGH (ref 0–99)
NonHDL: 173.02
Total CHOL/HDL Ratio: 4
Triglycerides: 86 mg/dL (ref 0.0–149.0)
VLDL: 17.2 mg/dL (ref 0.0–40.0)

## 2022-07-03 LAB — TSH: TSH: 2.73 u[IU]/mL (ref 0.35–5.50)

## 2022-07-03 NOTE — Patient Instructions (Signed)
Health Maintenance, Male Adopting a healthy lifestyle and getting preventive care are important in promoting health and wellness. Ask your health care provider about: The right schedule for you to have regular tests and exams. Things you can do on your own to prevent diseases and keep yourself healthy. What should I know about diet, weight, and exercise? Eat a healthy diet  Eat a diet that includes plenty of vegetables, fruits, low-fat dairy products, and lean protein. Do not eat a lot of foods that are high in solid fats, added sugars, or sodium. Maintain a healthy weight Body mass index (BMI) is a measurement that can be used to identify possible weight problems. It estimates body fat based on height and weight. Your health care provider can help determine your BMI and help you achieve or maintain a healthy weight. Get regular exercise Get regular exercise. This is one of the most important things you can do for your health. Most adults should: Exercise for at least 150 minutes each week. The exercise should increase your heart rate and make you sweat (moderate-intensity exercise). Do strengthening exercises at least twice a week. This is in addition to the moderate-intensity exercise. Spend less time sitting. Even light physical activity can be beneficial. Watch cholesterol and blood lipids Have your blood tested for lipids and cholesterol at 56 years of age, then have this test every 5 years. You may need to have your cholesterol levels checked more often if: Your lipid or cholesterol levels are high. You are older than 56 years of age. You are at high risk for heart disease. What should I know about cancer screening? Many types of cancers can be detected early and may often be prevented. Depending on your health history and family history, you may need to have cancer screening at various ages. This may include screening for: Colorectal cancer. Prostate cancer. Skin cancer. Lung  cancer. What should I know about heart disease, diabetes, and high blood pressure? Blood pressure and heart disease High blood pressure causes heart disease and increases the risk of stroke. This is more likely to develop in people who have high blood pressure readings or are overweight. Talk with your health care provider about your target blood pressure readings. Have your blood pressure checked: Every 3-5 years if you are 18-39 years of age. Every year if you are 40 years old or older. If you are between the ages of 65 and 75 and are a current or former smoker, ask your health care provider if you should have a one-time screening for abdominal aortic aneurysm (AAA). Diabetes Have regular diabetes screenings. This checks your fasting blood sugar level. Have the screening done: Once every three years after age 45 if you are at a normal weight and have a low risk for diabetes. More often and at a younger age if you are overweight or have a high risk for diabetes. What should I know about preventing infection? Hepatitis B If you have a higher risk for hepatitis B, you should be screened for this virus. Talk with your health care provider to find out if you are at risk for hepatitis B infection. Hepatitis C Blood testing is recommended for: Everyone born from 1945 through 1965. Anyone with known risk factors for hepatitis C. Sexually transmitted infections (STIs) You should be screened each year for STIs, including gonorrhea and chlamydia, if: You are sexually active and are younger than 56 years of age. You are older than 56 years of age and your   health care provider tells you that you are at risk for this type of infection. Your sexual activity has changed since you were last screened, and you are at increased risk for chlamydia or gonorrhea. Ask your health care provider if you are at risk. Ask your health care provider about whether you are at high risk for HIV. Your health care provider  may recommend a prescription medicine to help prevent HIV infection. If you choose to take medicine to prevent HIV, you should first get tested for HIV. You should then be tested every 3 months for as long as you are taking the medicine. Follow these instructions at home: Alcohol use Do not drink alcohol if your health care provider tells you not to drink. If you drink alcohol: Limit how much you have to 0-2 drinks a day. Know how much alcohol is in your drink. In the [place] one drink equals one 12 oz bottle of beer (355 mL), one 5 oz glass of wine (148 mL), or one 1 oz glass of hard liquor (44 mL). Lifestyle Do not use any products that contain nicotine or tobacco. These products include cigarettes, chewing tobacco, and vaping devices, such as e-cigarettes. If you need help quitting, ask your health care provider. Do not use street drugs. Do not share needles. Ask your health care provider for help if you need support or information about quitting drugs. General instructions Schedule regular health, dental, and eye exams. Stay current with your vaccines. Tell your health care provider if: You often feel depressed. You have ever been abused or do not feel safe at home. Summary Adopting a healthy lifestyle and getting preventive care are important in promoting health and wellness. Follow your health care provider's instructions about healthy diet, exercising, and getting tested or screened for diseases. Follow your health care provider's instructions on monitoring your cholesterol and blood pressure. This information is not intended to replace advice given to you by your health care provider. Make sure you discuss any questions you have with your health care provider. Document Revised: 01/24/2021 Document Reviewed: 01/24/2021 Elsevier Patient Education  2023 [employer]  

## 2022-07-03 NOTE — Progress Notes (Signed)
Office Note 07/03/2022  CC:  Chief Complaint  Patient presents with   Annual Exam    Pt is fasting   HPI:  Patient is a 56 y.o. male who is here for annual health maintenance exam. Elta Guadeloupe is feeling well. He has recently had a spontaneous right eye retinal tear and has had to change his exercise from jogging to walking. The eye is getting better, he is followed by an ophthalmologist. He eats very healthy diet.    Past Medical History:  Diagnosis Date   Hyperlipidemia    mild, no med indicated 02/2017.  Unchanged 02/2018 and 02/2019--[facility]   Lumbar radiculopathy 07/2015   Tinnitus 04/15/2012    Past Surgical History:  Procedure Laterality Date   COLONOSCOPY  05/25/2017   Hyperplastic polyp x 2.  Recall 05/2027.   Lumbar epiduarl steroid injection     RETINAL TEAR REPAIR CRYOTHERAPY  05/2021    Family History  Problem Relation Age of Onset   Hypertension Mother    Hypertension Maternal Grandmother    Prostate cancer Maternal Grandfather    Colon cancer Neg Hx     Social History   Socioeconomic History   Marital status: Married    Spouse name: Not on file   Number of children: Not on file   Years of education: Not on file   Highest education level: Not on file  Occupational History   Not on file  Tobacco Use   Smoking status: Never   Smokeless tobacco: Never  Vaping Use   Vaping Use: Never used  Substance and Sexual Activity   Alcohol use: No   Drug use: No   Sexual activity: Yes  Other Topics Concern   Not on file  Social History Narrative   Married, has 4 children (12, 78, 20, 24--as of 02/2019).   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  Wife has degree in education.   Relocated for [employer] approx 2006 to work for [employer].   No T/A/Ds.   Exercises: walks in neighborhood 3-4 times per week.   Social Determinants of Health   Financial Resource Strain: Not on file  Food Insecurity: Not on file  Transportation Needs: Not on file  Physical  Activity: Not on file  Stress: Not on file  Social Connections: Not on file  Intimate Partner Violence: Not on file    Outpatient Medications Prior to Visit  Medication Sig Dispense Refill   Ascorbic Acid (VITAMIN C PO) Take 1 capsule by mouth daily.     Calcium-Magnesium-Vitamin D (CALCIUM MAGNESIUM PO) Take 1 capsule by mouth daily.     cholecalciferol (VITAMIN D) 1000 units tablet Take 1,000 Units by mouth daily.     metroNIDAZOLE (METROGEL) 0.75 % gel Apply 1 application topically 2 (two) times daily. (Patient not taking: Reported on 07/03/2022) 45 g 5   No facility-administered medications prior to visit.    No Known Allergies  ROS Review of Systems  Constitutional:  Negative for appetite change, chills, fatigue and fever.  HENT:  Negative for congestion, dental problem, ear pain and sore throat.   Eyes:  Negative for discharge, redness and visual disturbance.  Respiratory:  Negative for cough, chest tightness, shortness of breath and wheezing.   Cardiovascular:  Negative for chest pain, palpitations and leg swelling.  Gastrointestinal:  Negative for abdominal pain, blood in stool, diarrhea, nausea and vomiting.  Genitourinary:  Negative for difficulty urinating, dysuria, flank pain, frequency, hematuria and urgency.  Musculoskeletal:  Negative for arthralgias, back pain,  joint swelling, myalgias and neck stiffness.  Skin:  Negative for pallor and rash.  Neurological:  Negative for dizziness, speech difficulty, weakness and headaches.  Hematological:  Negative for adenopathy. Does not bruise/bleed easily.  Psychiatric/Behavioral:  Negative for confusion and sleep disturbance. The patient is not nervous/anxious.     PE;    07/03/2022    8:02 AM 04/06/2021    8:52 AM 04/06/2021    8:20 AM  Vitals with BMI  Height '5\' 11"'$   '5\' 11"'$   Weight 171 lbs 10 oz  176 lbs 6 oz  BMI 43.15  40.08  Systolic 676 195 093  Diastolic 74 76 83  Pulse 58  55     Gen: Alert, well  appearing.  Patient is oriented to person, place, time, and situation. AFFECT: pleasant, lucid thought and speech. ENT: Ears: EACs clear, normal epithelium.  TMs with good light reflex and landmarks bilaterally.  Eyes: no injection, icteris, swelling, or exudate.  EOMI, PERRLA. Nose: no drainage or turbinate edema/swelling.  No injection or focal lesion.  Mouth: lips without lesion/swelling.  Oral mucosa pink and moist.  Dentition intact and without obvious caries or gingival swelling.  Oropharynx without erythema, exudate, or swelling.  Neck: supple/nontender.  No LAD, mass, or TM.  Carotid pulses 2+ bilaterally, without bruits. CV: RRR, no m/r/g.   LUNGS: CTA bilat, nonlabored resps, good aeration in all lung fields. ABD: soft, NT, ND, BS normal.  No hepatospenomegaly or mass.  No bruits. EXT: no clubbing, cyanosis, or edema.  Musculoskeletal: no joint swelling, erythema, warmth, or tenderness.  ROM of all joints intact. Skin - no sores or suspicious lesions or rashes or color changes  Pertinent labs:  Lab Results  Component Value Date   TSH 2.44 04/06/2021   Lab Results  Component Value Date   WBC 3.3 (L) 04/06/2021   HGB 13.6 04/06/2021   HCT 39.8 04/06/2021   MCV 88.0 04/06/2021   PLT 138.0 (L) 04/06/2021   Lab Results  Component Value Date   CREATININE 1.05 04/06/2021   BUN 20 04/06/2021   NA 141 04/06/2021   K 4.2 04/06/2021   CL 102 04/06/2021   CO2 30 04/06/2021   Lab Results  Component Value Date   ALT 20 04/06/2021   AST 19 04/06/2021   ALKPHOS 58 04/06/2021   BILITOT 0.7 04/06/2021   Lab Results  Component Value Date   CHOL 233 (H) 04/06/2021   Lab Results  Component Value Date   HDL 62.30 04/06/2021   Lab Results  Component Value Date   LDLCALC 154 (H) 04/06/2021   Lab Results  Component Value Date   TRIG 82.0 04/06/2021   Lab Results  Component Value Date   CHOLHDL 4 04/06/2021   Lab Results  Component Value Date   PSA 0.84 04/06/2021    PSA 0.74 04/06/2020   PSA 0.78 03/12/2019   ASSESSMENT AND PLAN:   Health maintenance exam: Reviewed age and gender appropriate health maintenance issues (prudent diet, regular exercise, health risks of tobacco and excessive alcohol, use of seatbelts, fire alarms in home, use of sunscreen).  Also reviewed age and gender appropriate health screening as well as vaccine recommendations. Vaccines: Tdap->declined.  Flu->declined. Labs: fasting HP + PSA Prostate ca screening: PSA today Colon ca screening: recall 2028.   An After Visit Summary was printed and given to the patient.  FOLLOW UP:  Return in about 1 year (around 07/04/2023).  Signed:  Crissie Sickles, MD  07/03/2022  

## 2022-08-01 ENCOUNTER — Telehealth: Payer: 59 | Admitting: Nurse Practitioner

## 2022-08-01 DIAGNOSIS — J0110 Acute frontal sinusitis, unspecified: Secondary | ICD-10-CM

## 2022-08-01 MED ORDER — AMOXICILLIN-POT CLAVULANATE 875-125 MG PO TABS: 1.0000 | ORAL_TABLET | Freq: Two times a day (BID) | ORAL | 0 refills | Status: AC

## 2022-08-01 MED ORDER — FLUTICASONE PROPIONATE 50 MCG/ACT NA SUSP: 2.0000 | Freq: Every day | NASAL | 6 refills | Status: AC

## 2022-08-01 NOTE — Progress Notes (Signed)
Virtual Visit Consent   Alan Rojas, you are scheduled for a virtual visit with a [facility] provider today. Just as with appointments in the office, your consent must be obtained to participate. Your consent will be active for this visit and any virtual visit you may have with one of our providers in the next 365 days. If you have a MyChart account, a copy of this consent can be sent to you electronically.  As this is a virtual visit, video technology does not allow for your provider to perform a traditional examination. This may limit your provider's ability to fully assess your condition. If your provider identifies any concerns that need to be evaluated in person or the need to arrange testing (such as labs, EKG, etc.), we will make arrangements to do so. Although advances in technology are sophisticated, we cannot ensure that it will always work on either your end or our end. If the connection with a video visit is poor, the visit may have to be switched to a telephone visit. With either a video or telephone visit, we are not always able to ensure that we have a secure connection.  By engaging in this virtual visit, you consent to the provision of healthcare and authorize for your insurance to be billed (if applicable) for the services provided during this visit. Depending on your insurance coverage, you may receive a charge related to this service.  I need to obtain your verbal consent now. Are you willing to proceed with your visit today? Alan Rojas has provided verbal consent on 08/01/2022 for a virtual visit (video or telephone). Apolonio Schneiders, FNP  Date: 08/01/2022 9:44 AM  Virtual Visit via Video Note   I, Apolonio Schneiders, connected with  Alan Rojas  ([id number], 1966-09-03) on 08/01/22 at  9:45 AM EST by a video-enabled telemedicine application and verified that I am speaking with the correct person using two identifiers.  Location: Patient: Virtual Visit Location Patient:  Home Provider: Virtual Visit Location Provider: Home Office   I discussed the limitations of evaluation and management by telemedicine and the availability of in person appointments. The patient expressed understanding and agreed to proceed.    History of Present Illness: Alan Rojas is a 56 y.o. who identifies as a male who was assigned male at birth, and is being seen today with complaints of nasal congestion for the past week.  Symptoms started as a sore throat.  Started to feel better yesterday and then was much worse today.   He has a cough as well that has been mostly dry.   He has been using Sinus Max for the past 5 days with some relief.   He did take a home COVID test that was negative.   Problems:  Patient Active Problem List   Diagnosis Date Noted   Maxillary sinusitis, acute 11/09/2015   Right lumbar radiculopathy 08/19/2015   Contact dermatitis 06/08/2015   Abscess of arm, right 06/01/2015   Transaminitis 01/16/2013   Fever, unspecified 01/13/2013   Leukopenia 01/13/2013   Bronchospasm with bronchitis, acute 01/13/2013   Cough 01/11/2013   Low back strain 09/04/2012   Radiculopathy of leg 09/04/2012    Allergies: No Known Allergies Medications:  Current Outpatient Medications:    Ascorbic Acid (VITAMIN C PO), Take 1 capsule by mouth daily., Disp: , Rfl:    Calcium-Magnesium-Vitamin D (CALCIUM MAGNESIUM PO), Take 1 capsule by mouth daily., Disp: , Rfl:    cholecalciferol (VITAMIN D) 1000  units tablet, Take 1,000 Units by mouth daily., Disp: , Rfl:    metroNIDAZOLE (METROGEL) 0.75 % gel, Apply 1 application topically 2 (two) times daily. (Patient not taking: Reported on 07/03/2022), Disp: 45 g, Rfl: 5  Observations/Objective: Patient is well-developed, well-nourished in no acute distress.  Resting comfortably  at home.  Head is normocephalic, atraumatic.  No labored breathing.  Speech is clear and coherent with logical content.  Patient is alert and  oriented at baseline.    Assessment and Plan: 1. Acute non-recurrent frontal sinusitis Continue decongestant for 1-2 more days.   - amoxicillin-clavulanate (AUGMENTIN) 875-125 MG tablet; Take 1 tablet by mouth 2 (two) times daily for 7 days. Take with food  Dispense: 14 tablet; Refill: 0 - fluticasone (FLONASE) 50 MCG/ACT nasal spray; Place 2 sprays into both nostrils daily.  Dispense: 16 g; Refill: 6     Follow Up Instructions: I discussed the assessment and treatment plan with the patient. The patient was provided an opportunity to ask questions and all were answered. The patient agreed with the plan and demonstrated an understanding of the instructions.  A copy of instructions were sent to the patient via MyChart unless otherwise noted below.    The patient was advised to call back or seek an in-person evaluation if the symptoms worsen or if the condition fails to improve as anticipated.  Time:  I spent 10 minutes with the patient via telehealth technology discussing the above problems/concerns.    Apolonio Schneiders, FNP

## 2022-08-18 ENCOUNTER — Telehealth: Payer: 59 | Admitting: Emergency Medicine

## 2022-08-18 DIAGNOSIS — R051 Acute cough: Secondary | ICD-10-CM

## 2022-08-18 NOTE — Progress Notes (Signed)
Because you were recently treated with antibiotics and because of the way you describe your cough and  mucus, I feel your condition warrants further evaluation and I recommend that you be seen in a face to face visit.   NOTE: There will be NO CHARGE for this eVisit   If you are having a true medical emergency please call 911.      For an urgent face to face visit, [facility] has seven urgent care centers for your convenience:     [facility] [place] [facility] [place] Get Driving Directions [phone] [address] [place], [place] [address]    [facility]) Get Driving Directions [phone] [place], [place] [address]  [facility] [place] ([place]) Get Driving Directions [phone] [address] [place],  [place]  [address]  [facility] - at [address] Get Driving Directions  [phone] [address] [place],  [place] [address]   [facility] Get Driving Directions [phone] [address], [place], [place] [address]   [facility] Get Driving Directions  [phone] [address] [place], [place] [address]   [facility] [place] Get Driving Directions [phone] [address] [place], [place] [address]  Your MyChart E-visit questionnaire answers were reviewed by a board certified advanced clinical practitioner to complete your personal care plan based on your specific symptoms.  Thank you for using e-Visits.

## 2023-08-07 NOTE — Progress Notes (Unsigned)
Office Note 08/08/2023  CC:  Chief Complaint  Patient presents with   Annual Exam    Pt is fasting.     HPI:  Patient is a 57 y.o. male who is here for annual health maintenance exam. Alan Rojas feels very well. He works from home and enjoys this. He runs 30 minutes a day and does light weights daily.  He started taking aspirin a day about 6 months ago.  He noticed he was having a little bit of abnormal sensation in his heart such as palpitation--only when lying in bed at night. After starting aspirin these episodes completely stopped.  Past Medical History:  Diagnosis Date   Hyperlipidemia    mild, no med indicated 02/2017.  Unchanged 02/2018 and 02/2019--[facility]   Lumbar radiculopathy 07/2015   Tinnitus 04/15/2012    Past Surgical History:  Procedure Laterality Date   COLONOSCOPY  05/25/2017   Hyperplastic polyp x 2.  Recall 05/2027.   Lumbar epiduarl steroid injection     RETINAL TEAR REPAIR CRYOTHERAPY  05/2021    Family History  Problem Relation Age of Onset   Hypertension Mother    Hypertension Maternal Grandmother    Prostate cancer Maternal Grandfather    Colon cancer Neg Hx     Social History   Socioeconomic History   Marital status: Married    Spouse name: Not on file   Number of children: Not on file   Years of education: Not on file   Highest education level: Master's degree (e.g., MA, MS, MEng, MEd, MSW, MBA)  Occupational History   Not on file  Tobacco Use   Smoking status: Never   Smokeless tobacco: Never  Vaping Use   Vaping status: Never Used  Substance and Sexual Activity   Alcohol use: No   Drug use: No   Sexual activity: Yes  Other Topics Concern   Not on file  Social History Narrative   Married, has 4 children (18, 22, 22, 24--as of 02/2019).   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  Wife has degree in education.   Relocated for [employer] approx 2006 to work for [employer].   No T/A/Ds.   Exercises: walks in neighborhood 3-4 times  per week.   Social Determinants of Health   Financial Resource Strain: Low Risk  (08/04/2023)   Overall Financial Resource Strain (CARDIA)    Difficulty of Paying Living Expenses: Not hard at all  Food Insecurity: No Food Insecurity (08/04/2023)   Hunger Vital Sign    Worried About Running Out of Food in the Last Year: Never true    Ran Out of Food in the Last Year: Never true  Transportation Needs: No Transportation Needs (08/04/2023)   PRAPARE - Administrator, Civil Service (Medical): No    Lack of Transportation (Non-Medical): No  Physical Activity: Sufficiently Active (08/04/2023)   Exercise Vital Sign    Days of Exercise per Week: 7 days    Minutes of Exercise per Session: 40 min  Stress: No Stress Concern Present (08/04/2023)   [employer] of Occupational Health - Occupational Stress Questionnaire    Feeling of Stress : Not at all  Social Connections: Socially Integrated (08/04/2023)   Social Connection and Isolation Panel [NHANES]    Frequency of Communication with Friends and Family: More than three times a week    Frequency of Social Gatherings with Friends and Family: Three times a week    Attends Religious Services: More than 4 times per year  Active Member of Clubs or Organizations: Yes    Attends Banker Meetings: More than 4 times per year    Marital Status: Married  Catering manager Violence: Not on file    Outpatient Medications Prior to Visit  Medication Sig Dispense Refill   Ascorbic Acid (VITAMIN C PO) Take 1 capsule by mouth daily.     aspirin EC 81 MG tablet Take 81 mg by mouth daily. Swallow whole.     Calcium-Magnesium-Vitamin D (CALCIUM MAGNESIUM PO) Take 1 capsule by mouth daily.     cholecalciferol (VITAMIN D) 1000 units tablet Take 1,000 Units by mouth daily.     fluticasone (FLONASE) 50 MCG/ACT nasal spray Place 2 sprays into both nostrils daily. 16 g 6   metroNIDAZOLE (METROGEL) 0.75 % gel Apply 1 application  topically 2 (two) times daily. 45 g 5   No facility-administered medications prior to visit.    No Known Allergies  Review of Systems  Constitutional:  Negative for appetite change, chills, fatigue and fever.  HENT:  Negative for congestion, dental problem, ear pain and sore throat.   Eyes:  Negative for discharge, redness and visual disturbance.  Respiratory:  Negative for cough, chest tightness, shortness of breath and wheezing.   Cardiovascular:  Negative for chest pain, palpitations and leg swelling.  Gastrointestinal:  Negative for abdominal pain, blood in stool, diarrhea, nausea and vomiting.  Genitourinary:  Negative for difficulty urinating, dysuria, flank pain, frequency, hematuria and urgency.  Musculoskeletal:  Negative for arthralgias, back pain, joint swelling, myalgias and neck stiffness.  Skin:  Negative for pallor and rash.  Neurological:  Negative for dizziness, speech difficulty, weakness and headaches.  Hematological:  Negative for adenopathy. Does not bruise/bleed easily.  Psychiatric/Behavioral:  Negative for confusion and sleep disturbance. The patient is not nervous/anxious.     PE;    08/08/2023    9:17 AM 08/08/2023    8:59 AM 07/03/2022    8:02 AM  Vitals with BMI  Height  6\' 0"  5\' 11"   Weight  173 lbs 6 oz 171 lbs 10 oz  BMI  23.51 23.94  Systolic 136 147 528  Diastolic 82 88 74  Pulse  45 58   Gen: Alert, well appearing.  Patient is oriented to person, place, time, and situation. AFFECT: pleasant, lucid thought and speech. ENT: Ears: EACs clear, normal epithelium.  TMs with good light reflex and landmarks bilaterally.  Eyes: no injection, icteris, swelling, or exudate.  EOMI, PERRLA. Nose: no drainage or turbinate edema/swelling.  No injection or focal lesion.  Mouth: lips without lesion/swelling.  Oral mucosa pink and moist.  Dentition intact and without obvious caries or gingival swelling.  Oropharynx without erythema, exudate, or swelling.  Neck:  supple/nontender.  No LAD, mass, or TM.  Carotid pulses 2+ bilaterally, without bruits. CV: RRR, no m/r/g.   LUNGS: CTA bilat, nonlabored resps, good aeration in all lung fields. ABD: soft, NT, ND, BS normal.  No hepatospenomegaly or mass.  No bruits. EXT: no clubbing, cyanosis, or edema.  Musculoskeletal: no joint swelling, erythema, warmth, or tenderness.  ROM of all joints intact. Skin - no sores or suspicious lesions or rashes or color changes  Pertinent labs:  Lab Results  Component Value Date   TSH 2.73 07/03/2022   Lab Results  Component Value Date   WBC 3.1 (L) 07/03/2022   HGB 13.4 07/03/2022   HCT 39.4 07/03/2022   MCV 87.2 07/03/2022   PLT 140.0 (L) 07/03/2022   Lab  Results  Component Value Date   CREATININE 0.96 07/03/2022   BUN 19 07/03/2022   NA 142 07/03/2022   K 4.2 07/03/2022   CL 103 07/03/2022   CO2 31 07/03/2022   Lab Results  Component Value Date   ALT 24 07/03/2022   AST 22 07/03/2022   ALKPHOS 54 07/03/2022   BILITOT 0.6 07/03/2022   Lab Results  Component Value Date   CHOL 238 (H) 07/03/2022   Lab Results  Component Value Date   HDL 64.90 07/03/2022   Lab Results  Component Value Date   LDLCALC 156 (H) 07/03/2022   Lab Results  Component Value Date   TRIG 86.0 07/03/2022   Lab Results  Component Value Date   CHOLHDL 4 07/03/2022   Lab Results  Component Value Date   PSA 0.75 07/03/2022   PSA 0.84 04/06/2021   PSA 0.74 04/06/2020   ASSESSMENT AND PLAN:   #1 health maintenance exam: Reviewed age and gender appropriate health maintenance issues (prudent diet, regular exercise, health risks of tobacco and excessive alcohol, use of seatbelts, fire alarms in home, use of sunscreen).  Also reviewed age and gender appropriate health screening as well as vaccine recommendations. Vaccines: Tdap->declined.  Flu->UTD.  Shingrix declined. Labs: fasting HP + PSA Prostate ca screening: PSA today Colon ca screening: recall 2028.   #2  palpitations. Given his description of this I suspect he was having some PACs. I do not know that the aspirin that he started taking did anything for this but he seems to have not had any symptoms since then.  Okay to continue aspirin 81 mg a day at this point.  An After Visit Summary was printed and given to the patient.  FOLLOW UP:  Return in about 1 year (around 08/07/2024) for annual CPE (fasting).  Signed:  Santiago Bumpers, MD           08/08/2023

## 2023-08-08 ENCOUNTER — Ambulatory Visit (INDEPENDENT_AMBULATORY_CARE_PROVIDER_SITE_OTHER): Payer: 59 | Admitting: Family Medicine

## 2023-08-08 ENCOUNTER — Encounter: Payer: Self-pay | Admitting: Family Medicine

## 2023-08-08 VITALS — BP 136/82 | HR 45 | Ht 72.0 in | Wt 173.4 lb

## 2023-08-08 DIAGNOSIS — E7800 Pure hypercholesterolemia, unspecified: Secondary | ICD-10-CM | POA: Diagnosis not present

## 2023-08-08 DIAGNOSIS — Z125 Encounter for screening for malignant neoplasm of prostate: Secondary | ICD-10-CM

## 2023-08-08 DIAGNOSIS — Z0000 Encounter for general adult medical examination without abnormal findings: Secondary | ICD-10-CM

## 2023-08-08 LAB — CBC WITH DIFFERENTIAL/PLATELET
Basophils Absolute: 0 10*3/uL (ref 0.0–0.1)
Basophils Relative: 0.6 % (ref 0.0–3.0)
Eosinophils Absolute: 0.1 10*3/uL (ref 0.0–0.7)
Eosinophils Relative: 2.1 % (ref 0.0–5.0)
HCT: 42.1 % (ref 39.0–52.0)
Hemoglobin: 13.8 g/dL (ref 13.0–17.0)
Lymphocytes Relative: 29.2 % (ref 12.0–46.0)
Lymphs Abs: 1 10*3/uL (ref 0.7–4.0)
MCHC: 32.9 g/dL (ref 30.0–36.0)
MCV: 90.5 fL (ref 78.0–100.0)
Monocytes Absolute: 0.4 10*3/uL (ref 0.1–1.0)
Monocytes Relative: 10.8 % (ref 3.0–12.0)
Neutro Abs: 1.9 10*3/uL (ref 1.4–7.7)
Neutrophils Relative %: 57.3 % (ref 43.0–77.0)
Platelets: 154 10*3/uL (ref 150.0–400.0)
RBC: 4.65 Mil/uL (ref 4.22–5.81)
RDW: 14.1 % (ref 11.5–15.5)
WBC: 3.4 10*3/uL — ABNORMAL LOW (ref 4.0–10.5)

## 2023-08-08 LAB — COMPREHENSIVE METABOLIC PANEL
ALT: 20 U/L (ref 0–53)
AST: 22 U/L (ref 0–37)
Albumin: 4.8 g/dL (ref 3.5–5.2)
Alkaline Phosphatase: 67 U/L (ref 39–117)
BUN: 20 mg/dL (ref 6–23)
CO2: 32 meq/L (ref 19–32)
Calcium: 10 mg/dL (ref 8.4–10.5)
Chloride: 103 meq/L (ref 96–112)
Creatinine, Ser: 1.05 mg/dL (ref 0.40–1.50)
GFR: 78.98 mL/min (ref >60.00)
Glucose, Bld: 93 mg/dL (ref 70–99)
Potassium: 4.9 meq/L (ref 3.5–5.1)
Sodium: 141 meq/L (ref 135–145)
Total Bilirubin: 0.6 mg/dL (ref 0.2–1.2)
Total Protein: 6.8 g/dL (ref 6.0–8.3)

## 2023-08-08 LAB — LIPID PANEL
Cholesterol: 237 mg/dL — ABNORMAL HIGH (ref 0–200)
HDL: 62.9 mg/dL (ref >39.00)
LDL Cholesterol: 159 mg/dL — ABNORMAL HIGH (ref 0–99)
NonHDL: 174.01
Total CHOL/HDL Ratio: 4
Triglycerides: 73 mg/dL (ref 0.0–149.0)
VLDL: 14.6 mg/dL (ref 0.0–40.0)

## 2023-08-08 LAB — PSA: PSA: 1.34 ng/mL (ref 0.10–4.00)

## 2023-08-08 NOTE — Patient Instructions (Signed)
Health Maintenance, Male Adopting a healthy lifestyle and getting preventive care are important in promoting health and wellness. Ask your health care provider about: The right schedule for you to have regular tests and exams. Things you can do on your own to prevent diseases and keep yourself healthy. What should I know about diet, weight, and exercise? Eat a healthy diet  Eat a diet that includes plenty of vegetables, fruits, low-fat dairy products, and lean protein. Do not eat a lot of foods that are high in solid fats, added sugars, or sodium. Maintain a healthy weight Body mass index (BMI) is a measurement that can be used to identify possible weight problems. It estimates body fat based on height and weight. Your health care provider can help determine your BMI and help you achieve or maintain a healthy weight. Get regular exercise Get regular exercise. This is one of the most important things you can do for your health. Most adults should: Exercise for at least 150 minutes each week. The exercise should increase your heart rate and make you sweat (moderate-intensity exercise). Do strengthening exercises at least twice a week. This is in addition to the moderate-intensity exercise. Spend less time sitting. Even light physical activity can be beneficial. Watch cholesterol and blood lipids Have your blood tested for lipids and cholesterol at 57 years of age, then have this test every 5 years. You may need to have your cholesterol levels checked more often if: Your lipid or cholesterol levels are high. You are older than 57 years of age. You are at high risk for heart disease. What should I know about cancer screening? Many types of cancers can be detected early and may often be prevented. Depending on your health history and family history, you may need to have cancer screening at various ages. This may include screening for: Colorectal cancer. Prostate cancer. Skin cancer. Lung  cancer. What should I know about heart disease, diabetes, and high blood pressure? Blood pressure and heart disease High blood pressure causes heart disease and increases the risk of stroke. This is more likely to develop in people who have high blood pressure readings or are overweight. Talk with your health care provider about your target blood pressure readings. Have your blood pressure checked: Every 3-5 years if you are 18-39 years of age. Every year if you are 40 years old or older. If you are between the ages of 65 and 75 and are a current or former smoker, ask your health care provider if you should have a one-time screening for abdominal aortic aneurysm (AAA). Diabetes Have regular diabetes screenings. This checks your fasting blood sugar level. Have the screening done: Once every three years after age 45 if you are at a normal weight and have a low risk for diabetes. More often and at a younger age if you are overweight or have a high risk for diabetes. What should I know about preventing infection? Hepatitis B If you have a higher risk for hepatitis B, you should be screened for this virus. Talk with your health care provider to find out if you are at risk for hepatitis B infection. Hepatitis C Blood testing is recommended for: Everyone born from 1945 through 1965. Anyone with known risk factors for hepatitis C. Sexually transmitted infections (STIs) You should be screened each year for STIs, including gonorrhea and chlamydia, if: You are sexually active and are younger than 57 years of age. You are older than 57 years of age and your   health care provider tells you that you are at risk for this type of infection. Your sexual activity has changed since you were last screened, and you are at increased risk for chlamydia or gonorrhea. Ask your health care provider if you are at risk. Ask your health care provider about whether you are at high risk for HIV. Your health care provider  may recommend a prescription medicine to help prevent HIV infection. If you choose to take medicine to prevent HIV, you should first get tested for HIV. You should then be tested every 3 months for as long as you are taking the medicine. Follow these instructions at home: Alcohol use Do not drink alcohol if your health care provider tells you not to drink. If you drink alcohol: Limit how much you have to 0-2 drinks a day. Know how much alcohol is in your drink. In the [place] one drink equals one 12 oz bottle of beer (355 mL), one 5 oz glass of wine (148 mL), or one 1 oz glass of hard liquor (44 mL). Lifestyle Do not use any products that contain nicotine or tobacco. These products include cigarettes, chewing tobacco, and vaping devices, such as e-cigarettes. If you need help quitting, ask your health care provider. Do not use street drugs. Do not share needles. Ask your health care provider for help if you need support or information about quitting drugs. General instructions Schedule regular health, dental, and eye exams. Stay current with your vaccines. Tell your health care provider if: You often feel depressed. You have ever been abused or do not feel safe at home. Summary Adopting a healthy lifestyle and getting preventive care are important in promoting health and wellness. Follow your health care provider's instructions about healthy diet, exercising, and getting tested or screened for diseases. Follow your health care provider's instructions on monitoring your cholesterol and blood pressure. This information is not intended to replace advice given to you by your health care provider. Make sure you discuss any questions you have with your health care provider. Document Revised: 01/24/2021 Document Reviewed: 01/24/2021 Elsevier Patient Education  2024 [employer]  

## 2024-08-20 ENCOUNTER — Ambulatory Visit (INDEPENDENT_AMBULATORY_CARE_PROVIDER_SITE_OTHER): Payer: 59 | Admitting: Family Medicine

## 2024-08-20 ENCOUNTER — Encounter: Payer: Self-pay | Admitting: Family Medicine

## 2024-08-20 VITALS — BP 126/83 | HR 57 | Temp 97.6°F | Ht 71.75 in | Wt 178.0 lb

## 2024-08-20 DIAGNOSIS — E7800 Pure hypercholesterolemia, unspecified: Secondary | ICD-10-CM

## 2024-08-20 DIAGNOSIS — Z9189 Other specified personal risk factors, not elsewhere classified: Secondary | ICD-10-CM | POA: Diagnosis not present

## 2024-08-20 DIAGNOSIS — Z125 Encounter for screening for malignant neoplasm of prostate: Secondary | ICD-10-CM | POA: Diagnosis not present

## 2024-08-20 DIAGNOSIS — Z0000 Encounter for general adult medical examination without abnormal findings: Secondary | ICD-10-CM | POA: Diagnosis not present

## 2024-08-20 LAB — CBC WITH DIFFERENTIAL/PLATELET
Basophils Absolute: 0 K/uL (ref 0.0–0.1)
Basophils Relative: 0.6 % (ref 0.0–3.0)
Eosinophils Absolute: 0.1 K/uL (ref 0.0–0.7)
Eosinophils Relative: 1.8 % (ref 0.0–5.0)
HCT: 41.7 % (ref 39.0–52.0)
Hemoglobin: 13.7 g/dL (ref 13.0–17.0)
Lymphocytes Relative: 30.2 % (ref 12.0–46.0)
Lymphs Abs: 0.8 K/uL (ref 0.7–4.0)
MCHC: 33 g/dL (ref 30.0–36.0)
MCV: 88.1 fl (ref 78.0–100.0)
Monocytes Absolute: 0.3 K/uL (ref 0.1–1.0)
Monocytes Relative: 10.1 % (ref 3.0–12.0)
Neutro Abs: 1.6 K/uL (ref 1.4–7.7)
Neutrophils Relative %: 57.3 % (ref 43.0–77.0)
Platelets: 145 K/uL — ABNORMAL LOW (ref 150.0–400.0)
RBC: 4.73 Mil/uL (ref 4.22–5.81)
RDW: 14 % (ref 11.5–15.5)
WBC: 2.8 K/uL — ABNORMAL LOW (ref 4.0–10.5)

## 2024-08-20 LAB — COMPREHENSIVE METABOLIC PANEL WITH GFR
ALT: 21 U/L (ref 0–53)
AST: 22 U/L (ref 0–37)
Albumin: 4.7 g/dL (ref 3.5–5.2)
Alkaline Phosphatase: 59 U/L (ref 39–117)
BUN: 16 mg/dL (ref 6–23)
CO2: 31 meq/L (ref 19–32)
Calcium: 9.9 mg/dL (ref 8.4–10.5)
Chloride: 105 meq/L (ref 96–112)
Creatinine, Ser: 1.06 mg/dL (ref 0.40–1.50)
GFR: 77.52 mL/min (ref >60.00)
Glucose, Bld: 96 mg/dL (ref 70–99)
Potassium: 4.8 meq/L (ref 3.5–5.1)
Sodium: 143 meq/L (ref 135–145)
Total Bilirubin: 0.7 mg/dL (ref 0.2–1.2)
Total Protein: 6.6 g/dL (ref 6.0–8.3)

## 2024-08-20 LAB — LIPID PANEL
Cholesterol: 263 mg/dL — ABNORMAL HIGH (ref 0–200)
HDL: 69.7 mg/dL (ref >39.00)
LDL Cholesterol: 177 mg/dL — ABNORMAL HIGH (ref 0–99)
NonHDL: 192.94
Total CHOL/HDL Ratio: 4
Triglycerides: 78 mg/dL (ref 0.0–149.0)
VLDL: 15.6 mg/dL (ref 0.0–40.0)

## 2024-08-20 LAB — PSA: PSA: 1.42 ng/mL (ref 0.10–4.00)

## 2024-08-20 NOTE — Progress Notes (Signed)
 Office Note 08/20/2024  CC:  Chief Complaint  Patient presents with   Annual Exam    Pt is fasting   HPI:  Patient is a 58 y.o. male who is here for annual health maintenance exam. Alan Rojas is feeling well.  He runs 3 miles every day. Stressful last few months at work, his company [employer]) is being bought out and there is job uncertainty.   Past Medical History:  Diagnosis Date   Hyperlipidemia    mild, no med indicated 02/2017.  Unchanged 02/2018 and 02/2019--[facility]   Lumbar radiculopathy 07/2015   Tinnitus 04/15/2012    Past Surgical History:  Procedure Laterality Date   COLONOSCOPY  05/25/2017   Hyperplastic polyp x 2.  Recall 05/2027.   Lumbar epiduarl steroid injection     RETINAL TEAR REPAIR CRYOTHERAPY  05/2021    Family History  Problem Relation Age of Onset   Hypertension Mother    Hypertension Maternal Grandmother    Prostate cancer Maternal Grandfather    Colon cancer Neg Hx     Social History   Socioeconomic History   Marital status: Married    Spouse name: Not on file   Number of children: Not on file   Years of education: Not on file   Highest education level: Master's degree (e.g., MA, MS, MEng, MEd, MSW, MBA)  Occupational History   Not on file  Tobacco Use   Smoking status: Never   Smokeless tobacco: Never  Vaping Use   Vaping status: Never Used  Substance and Sexual Activity   Alcohol use: No   Drug use: No   Sexual activity: Yes  Other Topics Concern   Not on file  Social History Narrative   Married, has 4 children (18, 22, 22, 24--as of 02/2019).   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  Wife has degree in education.   Relocated for [employer] approx 2006 to work for [employer].   No T/A/Ds.   Exercises: walks in neighborhood 3-4 times per week.   Social Drivers of Corporate Investment Banker Strain: Low Risk  (08/19/2024)   Overall Financial Resource Strain (CARDIA)    Difficulty of Paying Living Expenses: Not very hard  Food  Insecurity: No Food Insecurity (08/19/2024)   Hunger Vital Sign    Worried About Running Out of Food in the Last Year: Never true    Ran Out of Food in the Last Year: Never true  Transportation Needs: No Transportation Needs (08/19/2024)   PRAPARE - Administrator, Civil Service (Medical): No    Lack of Transportation (Non-Medical): No  Physical Activity: Sufficiently Active (08/19/2024)   Exercise Vital Sign    Days of Exercise per Week: 7 days    Minutes of Exercise per Session: 30 min  Stress: Stress Concern Present (08/19/2024)   [employer] of Occupational Health - Occupational Stress Questionnaire    Feeling of Stress: To some extent  Social Connections: Socially Integrated (08/19/2024)   Social Connection and Isolation Panel    Frequency of Communication with Friends and Family: More than three times a week    Frequency of Social Gatherings with Friends and Family: Twice a week    Attends Religious Services: More than 4 times per year    Active Member of [employer] or Organizations: Yes    Attends Engineer, Structural: More than 4 times per year    Marital Status: Married  Catering Manager Violence: Not on file    Outpatient Medications  Prior to Visit  Medication Sig Dispense Refill   Ascorbic Acid (VITAMIN C PO) Take 1 capsule by mouth daily.     aspirin EC 81 MG tablet Take 81 mg by mouth daily. Swallow whole.     Calcium-Magnesium-Vitamin D (CALCIUM MAGNESIUM PO) Take 1 capsule by mouth daily.     cholecalciferol (VITAMIN D) 1000 units tablet Take 1,000 Units by mouth daily.     fluticasone  (FLONASE ) 50 MCG/ACT nasal spray Place 2 sprays into both nostrils daily. 16 g 6   metroNIDAZOLE  (METROGEL ) 0.75 % gel Apply 1 application topically 2 (two) times daily. 45 g 5   No facility-administered medications prior to visit.    No Known Allergies  Review of Systems  Constitutional:  Negative for appetite change, chills, fatigue and fever.  HENT:  Negative  for congestion, dental problem, ear pain and sore throat.   Eyes:  Negative for discharge, redness and visual disturbance.  Respiratory:  Negative for cough, chest tightness, shortness of breath and wheezing.   Cardiovascular:  Negative for chest pain, palpitations and leg swelling.  Gastrointestinal:  Negative for abdominal pain, blood in stool, diarrhea, nausea and vomiting.  Genitourinary:  Negative for difficulty urinating, dysuria, flank pain, frequency, hematuria and urgency.  Musculoskeletal:  Negative for arthralgias, back pain, joint swelling, myalgias and neck stiffness.  Skin:  Negative for pallor and rash.  Neurological:  Negative for dizziness, speech difficulty, weakness and headaches.  Hematological:  Negative for adenopathy. Does not bruise/bleed easily.  Psychiatric/Behavioral:  Negative for confusion and sleep disturbance. The patient is not nervous/anxious.    PE;    08/20/2024    8:05 AM 08/08/2023    9:17 AM 08/08/2023    8:59 AM  Vitals with BMI  Height 5' 11.75  6' 0  Weight 178 lbs  173 lbs 6 oz  BMI 24.32  23.51  Systolic 126 136 852  Diastolic 83 82 88  Pulse 57  45    Gen: Alert, well appearing.  Patient is oriented to person, place, time, and situation. AFFECT: pleasant, lucid thought and speech. ENT: Ears: EACs clear, normal epithelium.  TMs with good light reflex and landmarks bilaterally.  Eyes: no injection, icteris, swelling, or exudate.  EOMI, PERRLA. Nose: no drainage or turbinate edema/swelling.  No injection or focal lesion.  Mouth: lips without lesion/swelling.  Oral mucosa pink and moist.  Dentition intact and without obvious caries or gingival swelling.  Oropharynx without erythema, exudate, or swelling.  Neck: supple/nontender.  No LAD, mass, or TM.  Carotid pulses 2+ bilaterally, without bruits. CV: RRR, no m/r/g.   LUNGS: CTA bilat, nonlabored resps, good aeration in all lung fields. ABD: soft, NT, ND, BS normal.  No hepatospenomegaly or  mass.  No bruits. EXT: no clubbing, cyanosis, or edema.  Musculoskeletal: no joint swelling, erythema, warmth, or tenderness.  ROM of all joints intact. Skin - no sores or suspicious lesions or rashes or color changes  Pertinent labs:  Lab Results  Component Value Date   TSH 2.73 07/03/2022   Lab Results  Component Value Date   WBC 3.4 (L) 08/08/2023   HGB 13.8 08/08/2023   HCT 42.1 08/08/2023   MCV 90.5 08/08/2023   PLT 154.0 08/08/2023   Lab Results  Component Value Date   CREATININE 1.05 08/08/2023   BUN 20 08/08/2023   NA 141 08/08/2023   K 4.9 08/08/2023   CL 103 08/08/2023   CO2 32 08/08/2023   Lab Results  Component Value  Date   ALT 20 08/08/2023   AST 22 08/08/2023   ALKPHOS 67 08/08/2023   BILITOT 0.6 08/08/2023   Lab Results  Component Value Date   CHOL 237 (H) 08/08/2023   Lab Results  Component Value Date   HDL 62.90 08/08/2023   Lab Results  Component Value Date   LDLCALC 159 (H) 08/08/2023   Lab Results  Component Value Date   TRIG 73.0 08/08/2023   Lab Results  Component Value Date   CHOLHDL 4 08/08/2023   Lab Results  Component Value Date   PSA 1.34 08/08/2023   PSA 0.75 07/03/2022   PSA 0.84 04/06/2021   ASSESSMENT AND PLAN:   #1 health maintenance exam: Reviewed age and gender appropriate health maintenance issues (prudent diet, regular exercise, health risks of tobacco and excessive alcohol, use of seatbelts, fire alarms in home, use of sunscreen).  Also reviewed age and gender appropriate health screening as well as vaccine recommendations. Vaccines: Tdap->declined.  Flu->declined.  Prevnar 20 declined. Labs: fasting HP + PSA Prostate ca screening: PSA today Colon ca screening: recall 2028.   #2 hyperlipidemia, borderline low/intermediate risk for coronary artery disease.  Monitor lipid panel today. Coronary calcium test ordered today.  An After Visit Summary was printed and given to the patient.  FOLLOW UP:  No follow-ups  on file.  Signed:  Gerlene Hockey, MD           08/20/2024

## 2024-08-20 NOTE — Patient Instructions (Signed)

## 2024-08-22 ENCOUNTER — Ambulatory Visit: Payer: Self-pay | Admitting: Family Medicine

## 2024-08-25 ENCOUNTER — Ambulatory Visit (HOSPITAL_BASED_OUTPATIENT_CLINIC_OR_DEPARTMENT_OTHER)
Admission: RE | Admit: 2024-08-25 | Discharge: 2024-08-25 | Disposition: A | Discharge status: Home / Self Care | Payer: Self-pay | Source: Ambulatory Visit | Attending: Family Medicine | Admitting: Family Medicine

## 2024-08-25 DIAGNOSIS — E7800 Pure hypercholesterolemia, unspecified: Secondary | ICD-10-CM | POA: Insufficient documentation

## 2024-08-25 DIAGNOSIS — Z9189 Other specified personal risk factors, not elsewhere classified: Secondary | ICD-10-CM | POA: Insufficient documentation

## 2024-08-28 MED ORDER — ATORVASTATIN CALCIUM 20 MG PO TABS: 20.0000 mg | ORAL_TABLET | Freq: Every day | ORAL | 2 refills | Status: AC

## 2025-08-21 ENCOUNTER — Encounter: Admitting: Family Medicine
